# Patient Record
Sex: Female | Born: 1985 | Race: White | Hispanic: No | Marital: Married | Smoking: Never smoker
Health system: Southern US, Community
[De-identification: ages and names within clinical notes are randomized; demographics above are authoritative.]

## PROBLEM LIST (undated history)

## (undated) DIAGNOSIS — R87629 Unspecified abnormal cytological findings in specimens from vagina: Secondary | ICD-10-CM

## (undated) DIAGNOSIS — D649 Anemia, unspecified: Secondary | ICD-10-CM

## (undated) HISTORY — PX: LEEP: SHX91

## (undated) HISTORY — DX: Unspecified abnormal cytological findings in specimens from vagina: R87.629

## (undated) HISTORY — PX: OTHER SURGICAL HISTORY: SHX169

---

## 2011-12-08 DIAGNOSIS — F988 Other specified behavioral and emotional disorders with onset usually occurring in childhood and adolescence: Secondary | ICD-10-CM | POA: Insufficient documentation

## 2015-01-08 DIAGNOSIS — R61 Generalized hyperhidrosis: Secondary | ICD-10-CM | POA: Insufficient documentation

## 2016-01-17 DIAGNOSIS — L709 Acne, unspecified: Secondary | ICD-10-CM | POA: Insufficient documentation

## 2016-01-17 DIAGNOSIS — Z8742 Personal history of other diseases of the female genital tract: Secondary | ICD-10-CM | POA: Insufficient documentation

## 2017-10-15 DIAGNOSIS — B373 Candidiasis of vulva and vagina: Secondary | ICD-10-CM | POA: Diagnosis not present

## 2017-10-26 DIAGNOSIS — R69 Illness, unspecified: Secondary | ICD-10-CM | POA: Diagnosis not present

## 2017-11-02 DIAGNOSIS — R69 Illness, unspecified: Secondary | ICD-10-CM | POA: Diagnosis not present

## 2017-11-05 DIAGNOSIS — J0300 Acute streptococcal tonsillitis, unspecified: Secondary | ICD-10-CM | POA: Diagnosis not present

## 2017-11-23 DIAGNOSIS — S335XXA Sprain of ligaments of lumbar spine, initial encounter: Secondary | ICD-10-CM | POA: Diagnosis not present

## 2017-11-23 DIAGNOSIS — M9903 Segmental and somatic dysfunction of lumbar region: Secondary | ICD-10-CM | POA: Diagnosis not present

## 2017-11-23 DIAGNOSIS — S76012A Strain of muscle, fascia and tendon of left hip, initial encounter: Secondary | ICD-10-CM | POA: Diagnosis not present

## 2017-11-23 DIAGNOSIS — M9902 Segmental and somatic dysfunction of thoracic region: Secondary | ICD-10-CM | POA: Diagnosis not present

## 2017-11-23 DIAGNOSIS — S46012A Strain of muscle(s) and tendon(s) of the rotator cuff of left shoulder, initial encounter: Secondary | ICD-10-CM | POA: Diagnosis not present

## 2017-11-23 DIAGNOSIS — M9901 Segmental and somatic dysfunction of cervical region: Secondary | ICD-10-CM | POA: Diagnosis not present

## 2017-11-23 DIAGNOSIS — M25512 Pain in left shoulder: Secondary | ICD-10-CM | POA: Diagnosis not present

## 2017-11-23 DIAGNOSIS — M25552 Pain in left hip: Secondary | ICD-10-CM | POA: Diagnosis not present

## 2017-11-30 DIAGNOSIS — M25512 Pain in left shoulder: Secondary | ICD-10-CM | POA: Diagnosis not present

## 2017-11-30 DIAGNOSIS — S46012A Strain of muscle(s) and tendon(s) of the rotator cuff of left shoulder, initial encounter: Secondary | ICD-10-CM | POA: Diagnosis not present

## 2017-11-30 DIAGNOSIS — M25552 Pain in left hip: Secondary | ICD-10-CM | POA: Diagnosis not present

## 2017-11-30 DIAGNOSIS — M9903 Segmental and somatic dysfunction of lumbar region: Secondary | ICD-10-CM | POA: Diagnosis not present

## 2017-11-30 DIAGNOSIS — M9901 Segmental and somatic dysfunction of cervical region: Secondary | ICD-10-CM | POA: Diagnosis not present

## 2017-11-30 DIAGNOSIS — M9902 Segmental and somatic dysfunction of thoracic region: Secondary | ICD-10-CM | POA: Diagnosis not present

## 2017-11-30 DIAGNOSIS — S335XXA Sprain of ligaments of lumbar spine, initial encounter: Secondary | ICD-10-CM | POA: Diagnosis not present

## 2017-11-30 DIAGNOSIS — S76012A Strain of muscle, fascia and tendon of left hip, initial encounter: Secondary | ICD-10-CM | POA: Diagnosis not present

## 2017-12-09 DIAGNOSIS — S46012A Strain of muscle(s) and tendon(s) of the rotator cuff of left shoulder, initial encounter: Secondary | ICD-10-CM | POA: Diagnosis not present

## 2017-12-09 DIAGNOSIS — M25552 Pain in left hip: Secondary | ICD-10-CM | POA: Diagnosis not present

## 2017-12-09 DIAGNOSIS — M9902 Segmental and somatic dysfunction of thoracic region: Secondary | ICD-10-CM | POA: Diagnosis not present

## 2017-12-09 DIAGNOSIS — M9903 Segmental and somatic dysfunction of lumbar region: Secondary | ICD-10-CM | POA: Diagnosis not present

## 2017-12-09 DIAGNOSIS — S335XXA Sprain of ligaments of lumbar spine, initial encounter: Secondary | ICD-10-CM | POA: Diagnosis not present

## 2017-12-09 DIAGNOSIS — S76012A Strain of muscle, fascia and tendon of left hip, initial encounter: Secondary | ICD-10-CM | POA: Diagnosis not present

## 2017-12-09 DIAGNOSIS — M9901 Segmental and somatic dysfunction of cervical region: Secondary | ICD-10-CM | POA: Diagnosis not present

## 2017-12-09 DIAGNOSIS — M25512 Pain in left shoulder: Secondary | ICD-10-CM | POA: Diagnosis not present

## 2018-02-15 DIAGNOSIS — M9903 Segmental and somatic dysfunction of lumbar region: Secondary | ICD-10-CM | POA: Diagnosis not present

## 2018-02-15 DIAGNOSIS — M9901 Segmental and somatic dysfunction of cervical region: Secondary | ICD-10-CM | POA: Diagnosis not present

## 2018-02-15 DIAGNOSIS — M9902 Segmental and somatic dysfunction of thoracic region: Secondary | ICD-10-CM | POA: Diagnosis not present

## 2018-02-15 DIAGNOSIS — S335XXA Sprain of ligaments of lumbar spine, initial encounter: Secondary | ICD-10-CM | POA: Diagnosis not present

## 2018-02-15 DIAGNOSIS — M25512 Pain in left shoulder: Secondary | ICD-10-CM | POA: Diagnosis not present

## 2018-02-15 DIAGNOSIS — S46012A Strain of muscle(s) and tendon(s) of the rotator cuff of left shoulder, initial encounter: Secondary | ICD-10-CM | POA: Diagnosis not present

## 2018-03-01 DIAGNOSIS — J00 Acute nasopharyngitis [common cold]: Secondary | ICD-10-CM | POA: Diagnosis not present

## 2018-03-07 DIAGNOSIS — J206 Acute bronchitis due to rhinovirus: Secondary | ICD-10-CM | POA: Diagnosis not present

## 2018-05-25 DIAGNOSIS — R87629 Unspecified abnormal cytological findings in specimens from vagina: Secondary | ICD-10-CM

## 2018-05-25 HISTORY — DX: Unspecified abnormal cytological findings in specimens from vagina: R87.629

## 2018-05-25 NOTE — L&D Delivery Note (Signed)
Patient is a 33 y.o. now G1P1 s/p NSVD at [redacted]w[redacted]d, who was admitted for PPROM.  She progressed with/without augmentation to complete and pushed 43 minutes to deliver. NICU called for delivery  Cord clamping delayed by one minute then clamped by CNM and cut by FOB. Baby taken to NICU for evaluation.  Placenta intact and spontaneous, bleeding minimal.  1st degree laceration repaired without difficulty.  Mom and baby stable prior to transfer to postpartum. She plans on breastfeeding. She requests POPs for birth control.  Delivery Note At 8:19 PM a viable and healthy female was delivered via  (Presentation: LOA ).  APGAR: 7, 8 ; weight 2566g (5lb 10.5oz) .    Placenta intact and spontaneous, bleeding minimal. 3V Cord:  with the following complications: preterm delivery at 35 weeks.  Cord pH: 7.19  Anesthesia:  Epidural Episiotomy:  none Lacerations: 1st degree Suture Repair: 3.0 vicryl Est. Blood Loss (mL): 153  Mom to postpartum.  Baby to Couplet care / Skin to Skin.  Lajean Manes CNM 01/23/2019, 8:44 PM

## 2018-07-25 ENCOUNTER — Encounter: Payer: Self-pay | Admitting: Obstetrics & Gynecology

## 2018-07-25 ENCOUNTER — Ambulatory Visit (INDEPENDENT_AMBULATORY_CARE_PROVIDER_SITE_OTHER): Payer: 59 | Admitting: Obstetrics & Gynecology

## 2018-07-25 VITALS — BP 114/75 | HR 71 | Ht 67.0 in | Wt 142.0 lb

## 2018-07-25 DIAGNOSIS — Z1151 Encounter for screening for human papillomavirus (HPV): Secondary | ICD-10-CM

## 2018-07-25 DIAGNOSIS — Z113 Encounter for screening for infections with a predominantly sexual mode of transmission: Secondary | ICD-10-CM

## 2018-07-25 DIAGNOSIS — O3440 Maternal care for other abnormalities of cervix, unspecified trimester: Secondary | ICD-10-CM

## 2018-07-25 DIAGNOSIS — Z3400 Encounter for supervision of normal first pregnancy, unspecified trimester: Secondary | ICD-10-CM

## 2018-07-25 DIAGNOSIS — Z124 Encounter for screening for malignant neoplasm of cervix: Secondary | ICD-10-CM | POA: Diagnosis not present

## 2018-07-25 DIAGNOSIS — R69 Illness, unspecified: Secondary | ICD-10-CM | POA: Diagnosis not present

## 2018-07-25 LAB — POCT URINALYSIS DIPSTICK OB
KETONES UA: NEGATIVE
Leukocytes, UA: NEGATIVE
NITRITE UA: NEGATIVE
PROTEIN: NEGATIVE
Spec Grav, UA: 1.015 (ref 1.010–1.025)
pH, UA: 6 (ref 5.0–8.0)

## 2018-07-25 NOTE — Patient Instructions (Addendum)
First Trimester of Pregnancy The first trimester of pregnancy is from week 1 until the end of week 13 (months 1 through 3). A week after a sperm fertilizes an egg, the egg will implant on the wall of the uterus. This embryo will begin to develop into a baby. Genes from you and your partner will form the baby. The female genes will determine whether the baby will be a boy or a girl. At 6-8 weeks, the eyes and face will be formed, and the heartbeat can be seen on ultrasound. At the end of 12 weeks, all the baby's organs will be formed. Now that you are pregnant, you will want to do everything you can to have a healthy baby. Two of the most important things are to get good prenatal care and to follow your health care provider's instructions. Prenatal care is all the medical care you receive before the baby's birth. This care will help prevent, find, and treat any problems during the pregnancy and childbirth. Body changes during your first trimester Your body goes through many changes during pregnancy. The changes vary from woman to woman.  You may gain or lose a couple of pounds at first.  You may feel sick to your stomach (nauseous) and you may throw up (vomit). If the vomiting is uncontrollable, call your health care provider.  You may tire easily.  You may develop headaches that can be relieved by medicines. All medicines should be approved by your health care provider.  You may urinate more often. Painful urination may mean you have a bladder infection.  You may develop heartburn as a result of your pregnancy.  You may develop constipation because certain hormones are causing the muscles that push stool through your intestines to slow down.  You may develop hemorrhoids or swollen veins (varicose veins).  Your breasts may begin to grow larger and become tender. Your nipples may stick out more, and the tissue that surrounds them (areola) may become darker.  Your gums may bleed and may be  sensitive to brushing and flossing.  Dark spots or blotches (chloasma, mask of pregnancy) may develop on your face. This will likely fade after the baby is born.  Your menstrual periods will stop.  You may have a loss of appetite.  You may develop cravings for certain kinds of food.  You may have changes in your emotions from day to day, such as being excited to be pregnant or being concerned that something may go wrong with the pregnancy and baby.  You may have more vivid and strange dreams.  You may have changes in your hair. These can include thickening of your hair, rapid growth, and changes in texture. Some women also have hair loss during or after pregnancy, or hair that feels dry or thin. Your hair will most likely return to normal after your baby is born. What to expect at prenatal visits During a routine prenatal visit:  You will be weighed to make sure you and the baby are growing normally.  Your blood pressure will be taken.  Your abdomen will be measured to track your baby's growth.  The fetal heartbeat will be listened to between weeks 10 and 14 of your pregnancy.  Test results from any previous visits will be discussed. Your health care provider may ask you:  How you are feeling.  If you are feeling the baby move.  If you have had any abnormal symptoms, such as leaking fluid, bleeding, severe headaches, or abdominal   cramping.  If you are using any tobacco products, including cigarettes, chewing tobacco, and electronic cigarettes.  If you have any questions. Other tests that may be performed during your first trimester include:  Blood tests to find your blood type and to check for the presence of any previous infections. The tests will also be used to check for low iron levels (anemia) and protein on red blood cells (Rh antibodies). Depending on your risk factors, or if you previously had diabetes during pregnancy, you may have tests to check for high blood sugar  that affects pregnant women (gestational diabetes).  Urine tests to check for infections, diabetes, or protein in the urine.  An ultrasound to confirm the proper growth and development of the baby.  Fetal screens for spinal cord problems (spina bifida) and Down syndrome.  HIV (human immunodeficiency virus) testing. Routine prenatal testing includes screening for HIV, unless you choose not to have this test.  You may need other tests to make sure you and the baby are doing well. Follow these instructions at home: Medicines  Follow your health care provider's instructions regarding medicine use. Specific medicines may be either safe or unsafe to take during pregnancy.  Take a prenatal vitamin that contains at least 600 micrograms (mcg) of folic acid.  If you develop constipation, try taking a stool softener if your health care provider approves. Eating and drinking   Eat a balanced diet that includes fresh fruits and vegetables, whole grains, good sources of protein such as meat, eggs, or tofu, and low-fat dairy. Your health care provider will help you determine the amount of weight gain that is right for you.  Avoid raw meat and uncooked cheese. These carry germs that can cause birth defects in the baby.  Eating four or five small meals rather than three large meals a day may help relieve nausea and vomiting. If you start to feel nauseous, eating a few soda crackers can be helpful. Drinking liquids between meals, instead of during meals, also seems to help ease nausea and vomiting.  Limit foods that are high in fat and processed sugars, such as fried and sweet foods.  To prevent constipation: ? Eat foods that are high in fiber, such as fresh fruits and vegetables, whole grains, and beans. ? Drink enough fluid to keep your urine clear or pale yellow. Activity  Exercise only as directed by your health care provider. Most women can continue their usual exercise routine during  pregnancy. Try to exercise for 30 minutes at least 5 days a week. Exercising will help you: ? Control your weight. ? Stay in shape. ? Be prepared for labor and delivery.  Experiencing pain or cramping in the lower abdomen or lower back is a good sign that you should stop exercising. Check with your health care provider before continuing with normal exercises.  Try to avoid standing for long periods of time. Move your legs often if you must stand in one place for a long time.  Avoid heavy lifting.  Wear low-heeled shoes and practice good posture.  You may continue to have sex unless your health care provider tells you not to. Relieving pain and discomfort  Wear a good support bra to relieve breast tenderness.  Take warm sitz baths to soothe any pain or discomfort caused by hemorrhoids. Use hemorrhoid cream if your health care provider approves.  Rest with your legs elevated if you have leg cramps or low back pain.  If you develop varicose veins in   your legs, wear support hose. Elevate your feet for 15 minutes, 3-4 times a day. Limit salt in your diet. Prenatal care  Schedule your prenatal visits by the twelfth week of pregnancy. They are usually scheduled monthly at first, then more often in the last 2 months before delivery.  Write down your questions. Take them to your prenatal visits.  Keep all your prenatal visits as told by your health care provider. This is important. Safety  Wear your seat belt at all times when driving.  Make a list of emergency phone numbers, including numbers for family, friends, the hospital, and police and fire departments. General instructions  Ask your health care provider for a referral to a local prenatal education class. Begin classes no later than the beginning of month 6 of your pregnancy.  Ask for help if you have counseling or nutritional needs during pregnancy. Your health care provider can offer advice or refer you to specialists for help  with various needs.  Do not use hot tubs, steam rooms, or saunas.  Do not douche or use tampons or scented sanitary pads.  Do not cross your legs for long periods of time.  Avoid cat litter boxes and soil used by cats. These carry germs that can cause birth defects in the baby and possibly loss of the fetus by miscarriage or stillbirth.  Avoid all smoking, herbs, alcohol, and medicines not prescribed by your health care provider. Chemicals in these products affect the formation and growth of the baby.  Do not use any products that contain nicotine or tobacco, such as cigarettes and e-cigarettes. If you need help quitting, ask your health care provider. You may receive counseling support and other resources to help you quit.  Schedule a dentist appointment. At home, brush your teeth with a soft toothbrush and be gentle when you floss. Contact a health care provider if:  You have dizziness.  You have mild pelvic cramps, pelvic pressure, or nagging pain in the abdominal area.  You have persistent nausea, vomiting, or diarrhea.  You have a bad smelling vaginal discharge.  You have pain when you urinate.  You notice increased swelling in your face, hands, legs, or ankles.  You are exposed to fifth disease or chickenpox.  You are exposed to [place] measles (rubella) and have never had it. Get help right away if:  You have a fever.  You are leaking fluid from your vagina.  You have spotting or bleeding from your vagina.  You have severe abdominal cramping or pain.  You have rapid weight gain or loss.  You vomit blood or material that looks like coffee grounds.  You develop a severe headache.  You have shortness of breath.  You have any kind of trauma, such as from a fall or a car accident. Summary  The first trimester of pregnancy is from week 1 until the end of week 13 (months 1 through 3).  Your body goes through many changes during pregnancy. The changes vary from  woman to woman.  You will have routine prenatal visits. During those visits, your health care provider will examine you, discuss any test results you may have, and talk with you about how you are feeling. This information is not intended to replace advice given to you by your health care provider. Make sure you discuss any questions you have with your health care provider. Document Released: 05/05/2001 Document Revised: 04/22/2016 Document Reviewed: 04/22/2016 Elsevier Interactive Patient Education  2019 [employer].  Cervical Cerclage  Cervical cerclage is a surgical procedure to correct a cervix that opens up and thins out before pregnancy is at term (cervical insufficiency, also called incompetent cervix). This condition can cause labor to start early (prematurely). This procedure involves using stitches to sew the cervix shut during pregnancy. Your surgeon may use ultrasound equipment to help guide the procedure and monitor your baby. Ultrasound equipment uses sound waves to take images of your cervix and uterus. Your surgeon will assess these images on a monitor in the operating room. Tell a health care provider about:  Any allergies you have, especially any allergies related to prescribed medicine, stitches, or anesthetic medicines.  All medicines you are taking, including vitamins, herbs, eye drops, creams, and over-the-counter medicines. Bring a list of all of your medicines to your appointment.  Your medical history, including prior labor deliveries.  Any problems you or family members have had with anesthetic medicines.  Any blood disorders you have.  Any surgeries you have had, including prior cervical stitching.  Any medical conditions you have.  Whether you are pregnant or may be pregnant. What are the risks? Generally, this is a safe procedure. However, problems may occur, including:  Infection, such as infection of the cervix or amniotic sac.  Vaginal  bleeding.  Allergic reactions to medicines.  Damage to other structures or organs, such as tearing (rupture) of membranes or cervical laceration.  Premature contractions including going into early labor and delivery.  Cervical dystocia, which occurs when the cervix is unable to dilate normally during labor. What happens before the procedure? Staying hydrated Follow instructions from your health care provider about hydration, which may include:  Up to 2 hours before the procedure - you may continue to drink clear liquids, such as water, clear fruit juice, black coffee, and plain tea. Eating and drinking restrictions Follow instructions from your health care provider about eating and drinking, which may include:  8 hours before the procedure - stop eating heavy meals or foods such as meat, fried foods, or fatty foods.  6 hours before the procedure - stop eating light meals or foods, such as toast or cereal.  6 hours before the procedure - stop drinking milk or drinks that contain milk.  2 hours before the procedure - stop drinking clear liquids. Medicines  Ask your health care provider about: ? Changing or stopping your regular medicines. This is especially important if you are taking diabetes medicines or blood thinners. ? Taking medicines such as aspirin and ibuprofen. These medicines can thin your blood. Do not take these medicines before your procedure if your health care provider instructs you not to.  You may be given antibiotic medicine to help prevent infection. General instructions  Do not put on any lotion, deodorant, or perfume.  Remove contact lenses and jewelry.  Ask your health care provider how your surgical site will be marked or identified.  You may have an exam or testing.  You may have a blood or urine sample taken.  Plan to have someone take you home from the hospital or clinic.  If you will be going home right after the procedure, plan to have someone  with you for 24 hours. What happens during the procedure?  To reduce your risk of infection: ? Your health care team will wash or sanitize their hands. ? Your skin will be washed with soap.  An IV tube will be inserted into one of your veins.  You may be given one  or more of the following: ? A medicine to help you relax (sedative). ? A medicine to numb the area (local anesthetic). ? A medicine to make you fall asleep (general anesthetic). ? A medicine that is injected into your spine to numb the area below and slightly above the injection site (spinal anesthetic). ? A medicine that is injected into an area of your body to numb everything below the injection site (regional anesthetic).  A lubricated instrument (speculum) will be inserted into your vagina. The speculum will be widened to open the walls of your vagina so your surgeon can see your cervix.  Your cervix will be grasped and tightly stitched closed (sutured). To do this, your surgeon will stitch a strong band of thread around your cervix, then the thread will be tightened to hold your cervix shut. The procedure may vary among health care providers and hospitals. What happens after the procedure?  Your blood pressure, heart rate, breathing rate, and blood oxygen level will be monitored until the medicines you were given have worn off. You will be monitored for premature contractions.  You may have light bleeding and mild cramping.  You may have to wear compression stockings. These stockings help to prevent blood clots and reduce swelling in your legs.  Do not drive for 24 hours if you received a sedative.  You may be put on bed rest.  You may be given medicine to prevent infection.  You may be given an injection of a hormone (progesterone) to prevent your uterus from tightening (contracting). Summary  Cervical cerclage is a surgical procedure that involves using stitches to sew the cervix shut during pregnancy.  Your  blood pressure, heart rate, breathing rate, and blood oxygen level will be monitored until the medicines you were given have worn off. You will be monitored for premature contractions.  You may need to be on bed rest after the procedure.  Plan to have someone take you home from the hospital or clinic. This information is not intended to replace advice given to you by your health care provider. Make sure you discuss any questions you have with your health care provider. Document Released: 04/23/2008 Document Revised: 01/03/2016 Document Reviewed: 12/26/2015 Elsevier Interactive Patient Education  2019 [employer].

## 2018-07-25 NOTE — Progress Notes (Signed)
  Subjective:    Debbie Harrison is being seen today for her first obstetrical visit. G1P0 This is not a planned pregnancy. She is at [redacted]w[redacted]d gestation. Her obstetrical history is significant for being told that she could not get pregnant after her CKC. . Relationship with FOB: significant other, living together. Patient does intend to breast feed. Pregnancy history fully reviewed.  Pt drinks ETOH ~ 2 glasses of win per week prior to pregnancy.  Pt has 2 dogs.    Cold Knife cone 6 year prev at [facility] Med Breast reconstruction 09/2017  Patient reports breast tenderness.  Review of Systems:   Review of Systems  Objective:     BP 114/75   Pulse 71   Ht 5\' 7"  (1.702 m)   Wt 142 lb (64.4 kg)   LMP 05/23/2018 (Approximate)   BMI 22.24 kg/m  Physical Exam  Exam General Appearance:    Alert, cooperative, no distress, appears stated age  Head:    Normocephalic, without obvious abnormality, atraumatic  Eyes:    conjunctiva/corneas clear, EOM's intact, both eyes  Ears:    Normal external ear canals, both ears  Nose:   Nares normal, septum midline, mucosa normal, no drainage    or sinus tenderness  Throat:   Lips, mucosa, and tongue normal; teeth and gums normal  Neck:   Supple, symmetrical, trachea midline, no adenopathy;    thyroid:  no enlargement/tenderness/nodules  Back:     Symmetric, no curvature, ROM normal, no CVA tenderness  Lungs:     Clear to auscultation bilaterally, respirations unlabored  Chest Wall:    No tenderness or deformity   Heart:    Regular rate and rhythm, S1 and S2 normal, no murmur, rub   or gallop  Breast Exam:    No tenderness, masses, or nipple abnormality; incision from breasts implants and reconstruction.   Abdomen:     Soft, non-tender, bowel sounds active all four quadrants,    no masses, no organomegaly  Genitalia:    Normal female without lesion, discharge or tenderness   Cervix appear short and distorted. Prev CKC changes  noted  Extremities:   Extremities  normal, atraumatic, no cyanosis or edema  Pulses:   2+ and symmetric all extremities  Skin:   Skin color, texture, turgor normal, no rashes or lesions      Assessment:    Pregnancy: G1P0 Patient Active Problem List   Diagnosis Date Noted  . Supervision of normal first pregnancy, antepartum 07/25/2018  . Cervical abnormality in pregnancy, antepartum 07/25/2018       Plan:     Initial labs drawn. Prenatal vitamins. Problem list reviewed and updated. AFP3 discussed: requested. Role of ultrasound in pregnancy discussed; fetal survey: requested. Amniocentesis discussed: not indicated. Follow up in 4 weeks. 60% of 60 min visit spent on counseling and coordination of care.  Requests 1st trimester screen [place] to TV length.  Pt given info on cerclage just for information at present.    Willodean Rosenthal 07/25/2018

## 2018-07-25 NOTE — Progress Notes (Signed)
DATING AND VIABILITY SONOGRAM   Debbie Harrison is a 33 y.o. year old G1P0 with LMP Patient's last menstrual period was 05/23/2018 (approximate). which would correlate to  [redacted]w[redacted]d weeks gestation.  She has irregular menstrual cycles.   She is here today for a confirmatory initial sonogram.    GESTATION: SINGLETON     FETAL ACTIVITY:          Heart rate      168          The fetus is active.   ADNEXA: The ovaries are normal.    GESTATIONAL AGE AND  BIOMETRICS:  Gestational criteria: Estimated Date of Delivery: 02/27/19 by LMP now at [redacted]w[redacted]d  Previous Scans:0  GESTATIONAL SAC           3.33cm        8-3weeks  CROWN RUMP LENGTH          2.26cm       9-0weeks                                                                               AVERAGE EGA(BY THIS SCAN): 8-5 weeks  WORKING EDD( LMP ):  02-27-2019     TECHNICIAN COMMENTS: Patient informed that the ultrasound is considered a limited obstetric ultrasound and is not intended to be a complete ultrasound exam. Patient also informed that the ultrasound is not being completed with the intent of assessing for fetal or placental anomalies or any pelvic abnormalities. Explained that the purpose of today's ultrasound is to assess for fetal heart rate. Patient acknowledges the purpose of the exam and the limitations of the study.   Armandina Stammer 07/25/2018 10:20 AM

## 2018-07-27 LAB — CULTURE, OB URINE

## 2018-07-27 LAB — URINE CULTURE, OB REFLEX: Organism ID, Bacteria: NO GROWTH

## 2018-07-28 LAB — CYTOLOGY - PAP
Chlamydia: NEGATIVE
Diagnosis: NEGATIVE
HPV: DETECTED — AB
Neisseria Gonorrhea: NEGATIVE

## 2018-08-02 LAB — OBSTETRIC PANEL, INCLUDING HIV
Antibody Screen: NEGATIVE
Basophils Absolute: 0 10*3/uL (ref 0.0–0.2)
Basos: 0 %
EOS (ABSOLUTE): 0.1 10*3/uL (ref 0.0–0.4)
Eos: 1 %
HEMATOCRIT: 37.4 % (ref 34.0–46.6)
HIV Screen 4th Generation wRfx: NONREACTIVE
Hemoglobin: 12.6 g/dL (ref 11.1–15.9)
Hepatitis B Surface Ag: NEGATIVE
Immature Grans (Abs): 0.1 10*3/uL (ref 0.0–0.1)
Immature Granulocytes: 1 %
Lymphocytes Absolute: 1.7 10*3/uL (ref 0.7–3.1)
Lymphs: 15 %
MCH: 30.6 pg (ref 26.6–33.0)
MCHC: 33.7 g/dL (ref 31.5–35.7)
MCV: 91 fL (ref 79–97)
Monocytes Absolute: 0.6 10*3/uL (ref 0.1–0.9)
Monocytes: 5 %
Neutrophils Absolute: 8.3 10*3/uL — ABNORMAL HIGH (ref 1.4–7.0)
Neutrophils: 78 %
Platelets: 254 10*3/uL (ref 150–450)
RBC: 4.12 x10E6/uL (ref 3.77–5.28)
RDW: 12.3 % (ref 11.7–15.4)
RPR: NONREACTIVE
Rh Factor: POSITIVE
Rubella Antibodies, IGG: 1.54 index (ref >0.99)
WBC: 10.7 10*3/uL (ref 3.4–10.8)

## 2018-08-02 LAB — CYSTIC FIBROSIS GENE TEST

## 2018-08-02 LAB — SMN1 COPY NUMBER ANALYSIS (SMA CARRIER SCREENING)

## 2018-08-15 ENCOUNTER — Encounter (HOSPITAL_COMMUNITY): Payer: Self-pay

## 2018-08-22 ENCOUNTER — Encounter (HOSPITAL_COMMUNITY): Payer: Self-pay

## 2018-08-22 ENCOUNTER — Ambulatory Visit (HOSPITAL_COMMUNITY): Payer: 59 | Admitting: *Deleted

## 2018-08-22 ENCOUNTER — Ambulatory Visit (HOSPITAL_COMMUNITY): Payer: 59

## 2018-08-22 ENCOUNTER — Other Ambulatory Visit (HOSPITAL_COMMUNITY): Payer: Self-pay | Admitting: *Deleted

## 2018-08-22 ENCOUNTER — Other Ambulatory Visit: Payer: Self-pay

## 2018-08-22 ENCOUNTER — Ambulatory Visit (HOSPITAL_COMMUNITY)
Admission: RE | Admit: 2018-08-22 | Discharge: 2018-08-22 | Disposition: A | Discharge status: Home / Self Care | Payer: 59 | Source: Ambulatory Visit | Attending: Obstetrics and Gynecology | Admitting: Obstetrics and Gynecology

## 2018-08-22 ENCOUNTER — Other Ambulatory Visit (HOSPITAL_COMMUNITY): Payer: Self-pay | Admitting: Obstetrics and Gynecology

## 2018-08-22 DIAGNOSIS — O3440 Maternal care for other abnormalities of cervix, unspecified trimester: Secondary | ICD-10-CM

## 2018-08-22 DIAGNOSIS — Z3400 Encounter for supervision of normal first pregnancy, unspecified trimester: Secondary | ICD-10-CM

## 2018-08-22 DIAGNOSIS — Z3A13 13 weeks gestation of pregnancy: Secondary | ICD-10-CM | POA: Diagnosis not present

## 2018-08-22 DIAGNOSIS — Z36 Encounter for antenatal screening for chromosomal anomalies: Secondary | ICD-10-CM | POA: Diagnosis not present

## 2018-08-22 DIAGNOSIS — Z9889 Other specified postprocedural states: Secondary | ICD-10-CM

## 2018-08-22 DIAGNOSIS — Z3682 Encounter for antenatal screening for nuchal translucency: Secondary | ICD-10-CM

## 2018-08-24 ENCOUNTER — Ambulatory Visit (INDEPENDENT_AMBULATORY_CARE_PROVIDER_SITE_OTHER): Payer: 59 | Admitting: Family Medicine

## 2018-08-24 VITALS — BP 112/73 | HR 84

## 2018-08-24 DIAGNOSIS — Z3A13 13 weeks gestation of pregnancy: Secondary | ICD-10-CM

## 2018-08-24 DIAGNOSIS — O3441 Maternal care for other abnormalities of cervix, first trimester: Secondary | ICD-10-CM

## 2018-08-24 DIAGNOSIS — O3440 Maternal care for other abnormalities of cervix, unspecified trimester: Secondary | ICD-10-CM

## 2018-08-24 DIAGNOSIS — Z3401 Encounter for supervision of normal first pregnancy, first trimester: Secondary | ICD-10-CM

## 2018-08-24 DIAGNOSIS — Z3400 Encounter for supervision of normal first pregnancy, unspecified trimester: Secondary | ICD-10-CM

## 2018-08-24 NOTE — Progress Notes (Signed)
   TELEHEALTH VIRTUAL OBSTETRICS VISIT ENCOUNTER NOTE  I connected with Debbie Harrison on 08/24/18 at  3:30 PM EDT by telephone at home and verified that I am speaking with the correct person using two identifiers.   I discussed the limitations, risks, security and privacy concerns of performing an evaluation and management service by telephone and the availability of in person appointments. I also discussed with the patient that there may be a patient responsible charge related to this service. The patient expressed understanding and agreed to proceed.  Subjective:  Debbie Harrison is a 33 y.o. G1P0 at [redacted]w[redacted]d being followed for ongoing prenatal care.  She is currently monitored for the following issues for this low-risk pregnancy and has Supervision of normal first pregnancy, antepartum and Cervical abnormality in pregnancy, antepartum on their problem list.  Patient reports no complaints. Reports fetal movement. Denies any contractions, bleeding or leaking of fluid.   The following portions of the patient's history were reviewed and updated as appropriate: allergies, current medications, past family history, past medical history, past social history, past surgical history and problem list.   Objective:   General:  Alert, oriented and cooperative.   Mental Status: Normal mood and affect perceived. Normal judgment and thought content.  Rest of physical exam deferred due to type of encounter  Assessment and Plan:  Pregnancy: G1P0 at [redacted]w[redacted]d 1. Supervision of normal first pregnancy, antepartum NT scan reviewed with patient. Low risk. Blood results still pending.  2. H/o LEEP Per Dr Judeth Cornfield, will get CL at anatomy scan  Preterm labor symptoms and general obstetric precautions including but not limited to vaginal bleeding, contractions, leaking of fluid and fetal movement were reviewed in detail with the patient.  I discussed the assessment and treatment plan with the patient. The patient was  provided an opportunity to ask questions and all were answered. The patient agreed with the plan and demonstrated an understanding of the instructions. The patient was advised to call back or seek an in-person office evaluation/go to MAU at [facility] for any urgent or concerning symptoms. Please refer to After Visit Summary for other counseling recommendations.   I provided 11 minutes of non-face-to-face time during this encounter.  Return in about 8 weeks (around 10/19/2018) for OB f/u.  Future Appointments  Date Time Provider Department Center  10/10/2018  7:40 AM WH-MFC NURSE WH-MFC MFC-US  10/10/2018  7:45 AM WH-MFC [place] 2 WH-MFCUS MFC-US    Levie Heritage, DO Center for [employer], [facility]

## 2018-09-13 ENCOUNTER — Other Ambulatory Visit (HOSPITAL_COMMUNITY): Payer: 59

## 2018-09-13 ENCOUNTER — Ambulatory Visit (HOSPITAL_COMMUNITY): Payer: 59

## 2018-10-10 ENCOUNTER — Other Ambulatory Visit: Payer: Self-pay

## 2018-10-10 ENCOUNTER — Ambulatory Visit (HOSPITAL_COMMUNITY): Payer: 59 | Admitting: *Deleted

## 2018-10-10 ENCOUNTER — Ambulatory Visit (HOSPITAL_COMMUNITY)
Admission: RE | Admit: 2018-10-10 | Discharge: 2018-10-10 | Disposition: A | Discharge status: Home / Self Care | Payer: 59 | Source: Ambulatory Visit | Attending: Obstetrics and Gynecology | Admitting: Obstetrics and Gynecology

## 2018-10-10 ENCOUNTER — Encounter (HOSPITAL_COMMUNITY): Payer: Self-pay

## 2018-10-10 VITALS — Temp 98.5°F

## 2018-10-10 DIAGNOSIS — Z9889 Other specified postprocedural states: Secondary | ICD-10-CM

## 2018-10-10 DIAGNOSIS — O3440 Maternal care for other abnormalities of cervix, unspecified trimester: Secondary | ICD-10-CM | POA: Diagnosis not present

## 2018-10-10 DIAGNOSIS — Z3400 Encounter for supervision of normal first pregnancy, unspecified trimester: Secondary | ICD-10-CM

## 2018-10-10 DIAGNOSIS — Z3A20 20 weeks gestation of pregnancy: Secondary | ICD-10-CM

## 2018-10-10 DIAGNOSIS — O3442 Maternal care for other abnormalities of cervix, second trimester: Secondary | ICD-10-CM | POA: Diagnosis not present

## 2018-10-10 DIAGNOSIS — Z363 Encounter for antenatal screening for malformations: Secondary | ICD-10-CM

## 2018-10-13 ENCOUNTER — Encounter: Payer: 59 | Admitting: Obstetrics & Gynecology

## 2018-10-20 ENCOUNTER — Ambulatory Visit (INDEPENDENT_AMBULATORY_CARE_PROVIDER_SITE_OTHER): Payer: 59 | Admitting: Family Medicine

## 2018-10-20 VITALS — BP 108/67 | HR 85 | Wt 147.0 lb

## 2018-10-20 DIAGNOSIS — Z3A21 21 weeks gestation of pregnancy: Secondary | ICD-10-CM

## 2018-10-20 DIAGNOSIS — O3442 Maternal care for other abnormalities of cervix, second trimester: Secondary | ICD-10-CM

## 2018-10-20 DIAGNOSIS — O3440 Maternal care for other abnormalities of cervix, unspecified trimester: Secondary | ICD-10-CM

## 2018-10-20 DIAGNOSIS — Z3400 Encounter for supervision of normal first pregnancy, unspecified trimester: Secondary | ICD-10-CM

## 2018-10-20 NOTE — Progress Notes (Signed)
TELEHEALTH OBSTETRICS PRENATAL VIRTUAL VIDEO VISIT ENCOUNTER NOTE  [facility]   I connected with Debbie Harrison on 10/20/18 at  1:30 PM EDT by WebEx Video Encounter at home and verified that I am speaking with the correct person using two identifiers.   I discussed the limitations, risks, security and privacy concerns of performing an evaluation and management service by telephone and the availability of in person appointments. I also discussed with the patient that there may be a patient responsible charge related to this service. The patient expressed understanding and agreed to proceed. Subjective:  Debbie Harrison is a 33 y.o. G1P0 at 403w3d being seen today for ongoing prenatal care.  She is currently monitored for the following issues for this high-risk pregnancy and has Supervision of normal first pregnancy, antepartum and Cervical abnormality in pregnancy, antepartum on their problem list.  Patient reports no complaints.  Contractions: Not present. Vag. Bleeding: None.  Movement: Present. Denies any leaking of fluid.   The following portions of the patient's history were reviewed and updated as appropriate: allergies, current medications, past family history, past medical history, past social history, past surgical history and problem list.   Objective:   Vitals:   10/20/18 1345  BP: 108/67  Pulse: 85  Weight: 147 lb (66.7 kg)    Fetal Status:     Movement: Present     General:  Alert, oriented and cooperative. Patient is in no acute distress.  Respiratory: Normal respiratory effort, no problems with respiration noted  Mental Status: Normal mood and affect. Normal behavior. Normal judgment and thought content.  Rest of physical exam deferred due to type of encounter  Imaging: [place] Mfm Ob Comp + 14 Wk  Result Date: 10/10/2018 ----------------------------------------------------------------------  OBSTETRICS REPORT                        (Signed Final 10/10/2018 09:22 am) ---------------------------------------------------------------------- Patient Info  ID #:       [id number]                          D.O.B.:  04/19/1986 (32 yrs)  Name:       Debbie Harrison                  Visit Date: 10/10/2018 07:41 am ---------------------------------------------------------------------- Performed By  Performed By:     Sandi MealyJovancia Adrien        Ref. Address:     [address]                    [address]                                                             [address] [place],                                                             [place] [id number]  Attending:        Noralee Spaceavi Shankar MD  [facility]  Referred By:      Willodean Rosenthal MD ---------------------------------------------------------------------- Orders   #  Description                          Code         Ordered By   1  [place] MFM OB COMP + 14 WK               69629.52     Lin Landsman  ----------------------------------------------------------------------   #  Order #                    Accession #                 Episode #   1  [id number]                  [id number]                  [id number]  ---------------------------------------------------------------------- Indications   Previous cervical surgery (LEEP)               O34.40   [redacted] weeks gestation of pregnancy                Z3A.20  ---------------------------------------------------------------------- Vital Signs  Weight (lb): 142                               Height:        5'7"  BMI:         22.24 ---------------------------------------------------------------------- Fetal Evaluation  Num Of Fetuses:         1  Fetal Heart Rate(bpm):  157  Cardiac Activity:       Observed  Presentation:           Cephalic  Placenta:                Posterior  P. Cord Insertion:      Visualized  Amniotic Fluid  AFI FV:      Within normal limits                              Largest Pocket(cm)                              3.58 ---------------------------------------------------------------------- Biometry  BPD:  45.9  mm     G. Age:  19w 6d         44  %    CI:        72.62   %    70 - 86                                                          FL/HC:      18.7   %    16.8 - 19.8  HC:      171.3  mm     G. Age:  19w 5d         29  %    HC/AC:      1.16        1.09 - 1.39  AC:      147.9  mm     G. Age:  20w 1d         47  %    FL/BPD:     69.7   %  FL:         32  mm     G. Age:  20w 0d         41  %    FL/AC:      21.6   %    20 - 24  HUM:      30.4  mm     G. Age:  20w 0d         55  %  CER:        21  mm     G. Age:  20w 0d         50  %  CM:        3.5  mm  Est. FW:     325  gm    0 lb 11 oz      50  % ---------------------------------------------------------------------- OB History  Gravidity:    1 ---------------------------------------------------------------------- Gestational Age  LMP:           20w 0d        Date:  05/23/18                 EDD:   02/27/19  U/S Today:     20w 0d                                        EDD:   02/27/19  Best:          20w 0d     Det. By:  LMP  (05/23/18)          EDD:   02/27/19 ---------------------------------------------------------------------- Anatomy  Cranium:               Appears normal         Aortic Arch:            Appears normal  Cavum:                 Appears normal         Ductal Arch:            Appears normal  Ventricles:  Appears normal         Diaphragm:              Appears normal  Choroid Plexus:        Appears normal         Stomach:                Appears normal, left                                                                        sided  Cerebellum:            Appears normal         Abdomen:                Appears normal  Posterior Fossa:       Appears normal         Abdominal  Wall:         Appears nml (cord                                                                        insert, abd wall)  Nuchal Fold:           Not well visualized    Cord Vessels:           Appears normal (3                                                                        vessel cord)  Face:                  Appears normal         Kidneys:                Appear normal                         (orbits and profile)  Lips:                  Appears normal         Bladder:                Appears normal  Thoracic:              Appears normal         Spine:                  Appears normal  Heart:                 Appears normal         Upper Extremities:      Appears normal                         (  4CH, axis, and                         situs)  RVOT:                  Appears normal         Lower Extremities:      Appears normal  LVOT:                  Appears normal ---------------------------------------------------------------------- Cervix Uterus Adnexa  Cervix  Length:           3.46  cm.  Normal appearance by transabdominal scan.  Left Ovary  Within normal limits.  Right Ovary  Within normal limits.  Cul De Sac  No free fluid seen.  Adnexa  No abnormality visualized. No adnexal mass  visualized. No free fluid. ---------------------------------------------------------------------- Impression  Patient returned for fetal anatomy scan. On first-trimester  screening, the risks of fetal aneuploidies were not increased.  We performed a fetal anatomy scan. No markers of  aneuploidies or fetal structural defects are seen. Fetal  biometry is consistent with her previously-established dates.  Amniotic fluid is normal and good fetal activity is seen.  Patient understands the limitations of ultrasound in detecting  fetal anomalies.  History of LEEP. On transabdominal scan, the cervix looks  long and closed (3.8 cm).  We reassured the patient of the findings.  ---------------------------------------------------------------------- Recommendations  Follow-up as clinically indicated. ----------------------------------------------------------------------                  Noralee Space, MD Electronically Signed Final Report   10/10/2018 09:22 am ----------------------------------------------------------------------   Assessment and Plan:  Pregnancy: G1P0 at [redacted]w[redacted]d 1. Supervision of normal first pregnancy, antepartum FM normal  2. Cervical abnormality in pregnancy, antepartum CL 3.8cm at [place].  Preterm labor symptoms and general obstetric precautions including but not limited to vaginal bleeding, contractions, leaking of fluid and fetal movement were reviewed in detail with the patient. I discussed the assessment and treatment plan with the patient. The patient was provided an opportunity to ask questions and all were answered. The patient agreed with the plan and demonstrated an understanding of the instructions. The patient was advised to call back or seek an in-person office evaluation/go to MAU at [facility] for any urgent or concerning symptoms. Please refer to After Visit Summary for other counseling recommendations.   I provided 11 minutes of face-to-face time during this encounter.  No follow-ups on file.  No future appointments.  Levie Heritage, DO Center for [employer], [facility]

## 2018-12-15 ENCOUNTER — Ambulatory Visit: Payer: 59 | Admitting: Family Medicine

## 2018-12-19 ENCOUNTER — Encounter: Payer: Self-pay | Admitting: Family Medicine

## 2018-12-19 ENCOUNTER — Other Ambulatory Visit: Payer: Self-pay

## 2018-12-19 ENCOUNTER — Ambulatory Visit (INDEPENDENT_AMBULATORY_CARE_PROVIDER_SITE_OTHER): Payer: 59 | Admitting: Family Medicine

## 2018-12-19 VITALS — BP 108/70 | HR 81 | Wt 157.0 lb

## 2018-12-19 DIAGNOSIS — Z23 Encounter for immunization: Secondary | ICD-10-CM

## 2018-12-19 DIAGNOSIS — O3440 Maternal care for other abnormalities of cervix, unspecified trimester: Secondary | ICD-10-CM

## 2018-12-19 DIAGNOSIS — Z3403 Encounter for supervision of normal first pregnancy, third trimester: Secondary | ICD-10-CM

## 2018-12-19 DIAGNOSIS — Z3A30 30 weeks gestation of pregnancy: Secondary | ICD-10-CM

## 2018-12-19 DIAGNOSIS — Z3400 Encounter for supervision of normal first pregnancy, unspecified trimester: Secondary | ICD-10-CM | POA: Diagnosis not present

## 2018-12-19 NOTE — Progress Notes (Signed)
   PRENATAL VISIT NOTE  Subjective:  Debbie Harrison is a 33 y.o. G1P0 at [redacted]w[redacted]d being seen today for ongoing prenatal care.  She is currently monitored for the following issues for this low-risk pregnancy and has Supervision of normal first pregnancy, antepartum and Cervical abnormality in pregnancy, antepartum on their problem list.  Patient reports no complaints.  Contractions: Not present. Vag. Bleeding: None.  Movement: Present. Denies leaking of fluid.   The following portions of the patient's history were reviewed and updated as appropriate: allergies, current medications, past family history, past medical history, past social history, past surgical history and problem list.   Objective:   Vitals:   12/19/18 0830  BP: 108/70  Pulse: 81  Weight: 157 lb (71.2 kg)    Fetal Status: Fetal Heart Rate (bpm): 155 Fundal Height: 30 cm Movement: Present     General:  Alert, oriented and cooperative. Patient is in no acute distress.  Skin: Skin is warm and dry. No rash noted.   Cardiovascular: Normal heart rate noted  Respiratory: Normal respiratory effort, no problems with respiration noted  Abdomen: Soft, gravid, appropriate for gestational age.  Pain/Pressure: Absent     Pelvic: Cervical exam deferred        Extremities: Normal range of motion.  Edema: None  Mental Status: Normal mood and affect. Normal behavior. Normal judgment and thought content.   Assessment and Plan:  Pregnancy: G1P0 at [redacted]w[redacted]d 1. Supervision of normal first pregnancy, antepartum FHT and FH normal. Occasional heartburn, better with tums. - Glucose Tolerance, 2 Hours w/1 Hour - HIV antibody (with reflex) - RPR - CBC  2. Cervical abnormality in pregnancy, antepartum H/o Leep. No evidence of PTL  Preterm labor symptoms and general obstetric precautions including but not limited to vaginal bleeding, contractions, leaking of fluid and fetal movement were reviewed in detail with the patient. Please refer to After  Visit Summary for other counseling recommendations.   Return in about 2 weeks (around 01/02/2019) for Virtual, OB f/u.  No future appointments.  Truett Mainland, DO

## 2018-12-19 NOTE — Progress Notes (Signed)
Patient doing 2 hour gtt. Kathrene Alu RN

## 2018-12-23 LAB — HIV ANTIBODY (ROUTINE TESTING W REFLEX): HIV Screen 4th Generation wRfx: NONREACTIVE

## 2018-12-23 LAB — CBC
Hematocrit: 34.5 % (ref 34.0–46.6)
Hemoglobin: 12.1 g/dL (ref 11.1–15.9)
MCH: 31.7 pg (ref 26.6–33.0)
MCHC: 35.1 g/dL (ref 31.5–35.7)
MCV: 90 fL (ref 79–97)
Platelets: 201 10*3/uL (ref 150–450)
RBC: 3.82 x10E6/uL (ref 3.77–5.28)
RDW: 11.9 % (ref 11.7–15.4)
WBC: 8.9 10*3/uL (ref 3.4–10.8)

## 2018-12-23 LAB — GLUCOSE TOLERANCE, 2 HOURS W/ 1HR
Glucose, 1 hour: 74 mg/dL (ref 65–179)
Glucose, Fasting: 77 mg/dL (ref 65–91)

## 2018-12-23 LAB — RPR: RPR Ser Ql: NONREACTIVE

## 2019-01-02 ENCOUNTER — Encounter: Payer: Self-pay | Admitting: Obstetrics & Gynecology

## 2019-01-02 ENCOUNTER — Ambulatory Visit (INDEPENDENT_AMBULATORY_CARE_PROVIDER_SITE_OTHER): Payer: 59 | Admitting: Obstetrics & Gynecology

## 2019-01-02 VITALS — Wt 155.0 lb

## 2019-01-02 DIAGNOSIS — O3440 Maternal care for other abnormalities of cervix, unspecified trimester: Secondary | ICD-10-CM

## 2019-01-02 DIAGNOSIS — Z3A32 32 weeks gestation of pregnancy: Secondary | ICD-10-CM

## 2019-01-02 DIAGNOSIS — O3443 Maternal care for other abnormalities of cervix, third trimester: Secondary | ICD-10-CM

## 2019-01-02 DIAGNOSIS — Z3400 Encounter for supervision of normal first pregnancy, unspecified trimester: Secondary | ICD-10-CM

## 2019-01-02 NOTE — Progress Notes (Signed)
   [place] VIRTUAL VIDEO VISIT ENCOUNTER NOTE  Provider location: Center for [place] at [facility]   I connected with Wendall Stade on 01/02/19 at  9:30 AM EDT by WebEx Video Encounter at home and verified that I am speaking with the correct person using two identifiers.   I discussed the limitations, risks, security and privacy concerns of performing an evaluation and management service virtually and the availability of in person appointments. I also discussed with the patient that there may be a patient responsible charge related to this service. The patient expressed understanding and agreed to proceed. Subjective:  Debbie Harrison is a 33 y.o. G1P0 at [redacted]w[redacted]d being seen today for ongoing prenatal care.  She is currently monitored for the following issues for this low-risk pregnancy and has Supervision of normal first pregnancy, antepartum and Cervical abnormality in pregnancy, antepartum on their problem list.  Patient reports no complaints.  Contractions: Not present. Vag. Bleeding: None.  Movement: Present. Denies any leaking of fluid.   The following portions of the patient's history were reviewed and updated as appropriate: allergies, current medications, past family history, past medical history, past social history, past surgical history and problem list.   Objective:   Vitals:   01/02/19 0934  Weight: 155 lb (70.3 kg)    Fetal Status:     Movement: Present     General:  Alert, oriented and cooperative. Patient is in no acute distress.  Respiratory: Normal respiratory effort, no problems with respiration noted  Mental Status: Normal mood and affect. Normal behavior. Normal judgment and thought content.  Rest of physical exam deferred due to type of encounter  Imaging: No results found.  Assessment and Plan:  Pregnancy: G1P0 at [redacted]w[redacted]d 1. Supervision of normal first pregnancy, antepartum No problems  Moving this week  2. Cervical  abnormality in pregnancy, antepartum   Preterm labor symptoms and general obstetric precautions including but not limited to vaginal bleeding, contractions, leaking of fluid and fetal movement were reviewed in detail with the patient. I discussed the assessment and treatment plan with the patient. The patient was provided an opportunity to ask questions and all were answered. The patient agreed with the plan and demonstrated an understanding of the instructions. The patient was advised to call back or seek an in-person office evaluation/go to MAU at [facility] for any urgent or concerning symptoms. Please refer to After Visit Summary for other counseling recommendations.   I provided 12 minutes of face-to-face time during this encounter.  Return in about 4 weeks (around 01/30/2019) for in person.  No future appointments.  Lavonia Drafts, MD Center for [employer], [place]

## 2019-01-22 ENCOUNTER — Inpatient Hospital Stay (HOSPITAL_COMMUNITY)
Admission: AD | Admit: 2019-01-22 | Discharge: 2019-01-25 | DRG: 807 | Disposition: A | Discharge status: Home / Self Care | Payer: No Typology Code available for payment source | Attending: Obstetrics and Gynecology | Admitting: Obstetrics and Gynecology

## 2019-01-22 ENCOUNTER — Encounter (HOSPITAL_COMMUNITY): Payer: Self-pay

## 2019-01-22 ENCOUNTER — Other Ambulatory Visit: Payer: Self-pay

## 2019-01-22 DIAGNOSIS — O42113 Preterm premature rupture of membranes, onset of labor more than 24 hours following rupture, third trimester: Secondary | ICD-10-CM | POA: Diagnosis not present

## 2019-01-22 DIAGNOSIS — Z3483 Encounter for supervision of other normal pregnancy, third trimester: Secondary | ICD-10-CM | POA: Diagnosis not present

## 2019-01-22 DIAGNOSIS — Z3A35 35 weeks gestation of pregnancy: Secondary | ICD-10-CM | POA: Diagnosis not present

## 2019-01-22 DIAGNOSIS — O42919 Preterm premature rupture of membranes, unspecified as to length of time between rupture and onset of labor, unspecified trimester: Secondary | ICD-10-CM | POA: Insufficient documentation

## 2019-01-22 DIAGNOSIS — Z3A34 34 weeks gestation of pregnancy: Secondary | ICD-10-CM | POA: Diagnosis not present

## 2019-01-22 DIAGNOSIS — Z20828 Contact with and (suspected) exposure to other viral communicable diseases: Secondary | ICD-10-CM | POA: Diagnosis present

## 2019-01-22 DIAGNOSIS — O42013 Preterm premature rupture of membranes, onset of labor within 24 hours of rupture, third trimester: Secondary | ICD-10-CM | POA: Diagnosis not present

## 2019-01-22 DIAGNOSIS — Z3A00 Weeks of gestation of pregnancy not specified: Secondary | ICD-10-CM | POA: Diagnosis not present

## 2019-01-22 DIAGNOSIS — Z3482 Encounter for supervision of other normal pregnancy, second trimester: Secondary | ICD-10-CM | POA: Diagnosis not present

## 2019-01-22 DIAGNOSIS — O42913 Preterm premature rupture of membranes, unspecified as to length of time between rupture and onset of labor, third trimester: Principal | ICD-10-CM | POA: Diagnosis present

## 2019-01-22 DIAGNOSIS — Z3490 Encounter for supervision of normal pregnancy, unspecified, unspecified trimester: Secondary | ICD-10-CM

## 2019-01-22 HISTORY — DX: Anemia, unspecified: D64.9

## 2019-01-22 LAB — URINALYSIS, ROUTINE W REFLEX MICROSCOPIC
Bilirubin Urine: NEGATIVE
Glucose, UA: NEGATIVE mg/dL
Ketones, ur: NEGATIVE mg/dL
Leukocytes,Ua: NEGATIVE
Nitrite: NEGATIVE
Protein, ur: NEGATIVE mg/dL
Specific Gravity, Urine: 1.004 — ABNORMAL LOW (ref 1.005–1.030)
pH: 6 (ref 5.0–8.0)

## 2019-01-22 LAB — CBC
HCT: 34.2 % — ABNORMAL LOW (ref 36.0–46.0)
Hemoglobin: 11.8 g/dL — ABNORMAL LOW (ref 12.0–15.0)
MCH: 29.7 pg (ref 26.0–34.0)
MCHC: 34.5 g/dL (ref 30.0–36.0)
MCV: 86.1 fL (ref 80.0–100.0)
Platelets: 217 10*3/uL (ref 150–400)
RBC: 3.97 MIL/uL (ref 3.87–5.11)
RDW: 12.5 % (ref 11.5–15.5)
WBC: 13.2 10*3/uL — ABNORMAL HIGH (ref 4.0–10.5)
nRBC: 0 % (ref 0.0–0.2)

## 2019-01-22 LAB — TYPE AND SCREEN
ABO/RH(D): A POS
Antibody Screen: NEGATIVE

## 2019-01-22 LAB — AMNISURE RUPTURE OF MEMBRANE (ROM) NOT AT ARMC: Amnisure ROM: POSITIVE

## 2019-01-22 LAB — SARS CORONAVIRUS 2 BY RT PCR (HOSPITAL ORDER, PERFORMED IN ~~LOC~~ HOSPITAL LAB): SARS Coronavirus 2: NEGATIVE

## 2019-01-22 MED ORDER — TERBUTALINE SULFATE 1 MG/ML IJ SOLN: 0.2500 mg | Freq: Once | INTRAMUSCULAR | Status: DC | PRN

## 2019-01-22 MED ORDER — SODIUM CHLORIDE 0.9 % IV SOLN: 2.0000 g | Freq: Four times a day (QID) | INTRAVENOUS | Status: DC

## 2019-01-22 MED ORDER — ONDANSETRON HCL 4 MG/2ML IJ SOLN: 4.0000 mg | Freq: Four times a day (QID) | INTRAMUSCULAR | Status: DC | PRN

## 2019-01-22 MED ORDER — OXYCODONE-ACETAMINOPHEN 5-325 MG PO TABS: 2.0000 | ORAL_TABLET | ORAL | Status: DC | PRN

## 2019-01-22 MED ORDER — MISOPROSTOL 50MCG HALF TABLET
50.0000 ug | ORAL_TABLET | Freq: Once | ORAL | Status: DC
  Filled 2019-01-22: qty 1

## 2019-01-22 MED ORDER — ACETAMINOPHEN 325 MG PO TABS: 650.0000 mg | ORAL_TABLET | ORAL | Status: DC | PRN

## 2019-01-22 MED ORDER — OXYCODONE-ACETAMINOPHEN 5-325 MG PO TABS: 1.0000 | ORAL_TABLET | ORAL | Status: DC | PRN

## 2019-01-22 MED ORDER — LIDOCAINE HCL (PF) 1 % IJ SOLN: 30.0000 mL | INTRAMUSCULAR | Status: DC | PRN

## 2019-01-22 MED ORDER — FLEET ENEMA 7-19 GM/118ML RE ENEM: 1.0000 | ENEMA | RECTAL | Status: DC | PRN

## 2019-01-22 MED ORDER — LACTATED RINGERS IV SOLN
500.0000 mL | INTRAVENOUS | Status: DC | PRN
  Administered 2019-01-23: 500 mL via INTRAVENOUS

## 2019-01-22 MED ORDER — OXYTOCIN BOLUS FROM INFUSION
500.0000 mL | Freq: Once | INTRAVENOUS | Status: AC
  Administered 2019-01-23: 500 mL via INTRAVENOUS

## 2019-01-22 MED ORDER — SODIUM CHLORIDE 0.9 % IV SOLN
5.0000 10*6.[IU] | Freq: Once | INTRAVENOUS | Status: AC
  Administered 2019-01-22: 5 10*6.[IU] via INTRAVENOUS
  Filled 2019-01-22: qty 5

## 2019-01-22 MED ORDER — LACTATED RINGERS IV SOLN: 500.0000 mL | INTRAVENOUS | Status: DC | PRN

## 2019-01-22 MED ORDER — BETAMETHASONE SOD PHOS & ACET 6 (3-3) MG/ML IJ SUSP
12.0000 mg | Freq: Once | INTRAMUSCULAR | Status: AC
  Administered 2019-01-22: 12 mg via INTRAMUSCULAR
  Filled 2019-01-22: qty 2

## 2019-01-22 MED ORDER — OXYTOCIN 40 UNITS IN NORMAL SALINE INFUSION - SIMPLE MED: 2.5000 [IU]/h | INTRAVENOUS | Status: DC

## 2019-01-22 MED ORDER — LACTATED RINGERS IV SOLN
INTRAVENOUS | Status: DC
  Administered 2019-01-22: 21:00:00 via INTRAVENOUS

## 2019-01-22 MED ORDER — ACETAMINOPHEN 325 MG PO TABS
650.0000 mg | ORAL_TABLET | ORAL | Status: DC | PRN
  Administered 2019-01-23: 650 mg via ORAL
  Filled 2019-01-22: qty 2

## 2019-01-22 MED ORDER — MISOPROSTOL 50MCG HALF TABLET
50.0000 ug | ORAL_TABLET | Freq: Once | ORAL | Status: AC
  Administered 2019-01-22: 50 ug via BUCCAL

## 2019-01-22 MED ORDER — PENICILLIN G 3 MILLION UNITS IVPB - SIMPLE MED
3.0000 10*6.[IU] | INTRAVENOUS | Status: DC
  Administered 2019-01-23 (×5): 3 10*6.[IU] via INTRAVENOUS
  Filled 2019-01-22 (×5): qty 100

## 2019-01-22 MED ORDER — LACTATED RINGERS IV SOLN
INTRAVENOUS | Status: DC
  Administered 2019-01-23 (×2): via INTRAVENOUS

## 2019-01-22 MED ORDER — TERBUTALINE SULFATE 1 MG/ML IJ SOLN: 0.2500 mg | Freq: Once | INTRAMUSCULAR | Status: DC

## 2019-01-22 MED ORDER — AZITHROMYCIN 500 MG PO TABS
1000.0000 mg | ORAL_TABLET | Freq: Once | ORAL | Status: DC
  Filled 2019-01-22: qty 2

## 2019-01-22 MED ORDER — LACTATED RINGERS IV BOLUS
1000.0000 mL | Freq: Once | INTRAVENOUS | Status: AC
  Administered 2019-01-22: 1000 mL via INTRAVENOUS

## 2019-01-22 MED ORDER — OXYTOCIN BOLUS FROM INFUSION: 500.0000 mL | Freq: Once | INTRAVENOUS | Status: DC

## 2019-01-22 MED ORDER — SOD CITRATE-CITRIC ACID 500-334 MG/5ML PO SOLN: 30.0000 mL | ORAL | Status: DC | PRN

## 2019-01-22 NOTE — MAU Note (Signed)
Presents with poss ROM 1815 today. Lower abd/back pain 7/10. Mucousy discharge with "dark, red blood" present. Dec FM. Last IC-3-4 days ago.   Gilmer Mor RN

## 2019-01-22 NOTE — H&P (Addendum)
OBSTETRIC ADMISSION HISTORY AND PHYSICAL  Debbie Harrison is a 33 y.o. female G1P0 with IUP at 6142w6d by LMP and 1st trimester US presenting for leaking of fluid and found to have ruptured membranes in MAU. Patient reports she took a nap and woke up around 1800 and noticed clear wet fluid all around her that continued to leak when she went to the restroom. Also notice some spotting and small bloody mucous. Feeling some contractions infrequently that she is rating at 1/10 for pain. She reports +Fms, no blurry vision, headaches or peripheral edema, and RUQ pain.  She plans on breast feeding. She request POP's for birth control. [facility]   Dating: By LMP and 1st trimester US --->  Estimated Date of Delivery: 02/27/19  Sono:  Anatomy scan on 10/10/2018  @[redacted]w[redacted]d , CWD, normal anatomy, cephalic presentation, posterior placenta lie, 325g, 50% EFW Fetal Evaluation  Num Of Fetuses:         1  Fetal Heart Rate(bpm):  157  Cardiac Activity:       Observed  Presentation:           Cephalic  Placenta:               Posterior  P. Cord Insertion:      Visualized  Amniotic Fluid  AFI FV:      Within normal limits                              Largest Pocket(cm)                              3.58  Prenatal History/Complications: None  Past Medical History: Past Medical History:  Diagnosis Date  . Anemia   . Vaginal Pap smear, abnormal 2020    Past Surgical History: Past Surgical History:  Procedure Laterality Date  . breast lift    . LEEP    . wisdom teeth extraxction      Obstetrical History: OB History    Gravida  1   Para      Term      Preterm      AB      Living  0     SAB      TAB      Ectopic      Multiple      Live Births              Social History: Social History   Socioeconomic History  . Marital status: Married    Spouse name: Luisa Hartatrick  . Number of children: Not on file  . Years of education: Not on file  . Highest education  level: Not on file  Occupational History  . Not on file  Social Needs  . Financial resource strain: Not hard at all  . Food insecurity    Worry: Never true    Inability: Never true  . Transportation needs    Medical: No    Non-medical: No  Tobacco Use  . Smoking status: Never Smoker  . Smokeless tobacco: Never Used  Substance and Sexual Activity  . Alcohol use: Not Currently    Alcohol/week: 3.0 standard drinks    Types: 3 Glasses of wine per week    Frequency: Never  . Drug use: Never  . Sexual activity: Yes    Birth control/protection: None  Lifestyle  . Physical activity    Days per week: Patient refused    Minutes per session: Patient refused  . Stress: Only a little  Relationships  . Social Herbalist on phone: Patient refused    Gets together: Patient refused    Attends religious service: Patient refused    Active member of club or organization: Patient refused    Attends meetings of clubs or organizations: Patient refused    Relationship status: Patient refused  Other Topics Concern  . Not on file  Social History Narrative  . Not on file    Family History: Family History  Problem Relation Age of Onset  . Hypertension Father     Allergies: No Known Allergies  Medications Prior to Admission  Medication Sig Dispense Refill Last Dose  . Prenatal MV-Min-Fe Fum-FA-DHA (PRENATAL 1 PO) Take by mouth.   01/22/2019 at Unknown time     Review of Systems   All systems reviewed and negative except as stated in HPI  Blood pressure 116/66, pulse 100, temperature 98.4 F (36.9 C), temperature source Oral, resp. rate 17, height 5\' 7"  (1.702 m), weight 70.3 kg, last menstrual period 05/23/2018, SpO2 99 %. General appearance: alert, cooperative, appears stated age and no distress Lungs: normal effort Heart: regular rate  Abdomen: soft, non-tender; bowel sounds normal Pelvic: gravid uterus  Extremities: Homans sign is negative, no sign of  DVT Presentation: cephalic by BSUS Fetal monitoringBaseline: 135 bpm, Variability: Good {> 6 bpm), Accelerations: Reactive and Decelerations: Absent Uterine activityNone Dilation: Closed Effacement (%): Thick Exam by:: Dr. Marice Potter   Prenatal labs: ABO, Rh: --/--/A POS, A POS Performed at [facility], [place] [address], [place], [place] [address]  [phone]) Antibody: NEG (08/30 2040) Rubella: 1.54 (03/02 1105) RPR: Non Reactive (07/27 1124)  HBsAg: Negative (03/02 1105)  HIV: Non Reactive (07/27 1124)  GBS:    2 hr Glucola WNL Genetic screening  WNL Anatomy US WNL  Prenatal Transfer Tool  Maternal Diabetes: No Genetic Screening: Normal Maternal Ultrasounds/Referrals: Normal Fetal Ultrasounds or other Referrals:  None Maternal Substance Abuse:  No Significant Maternal Medications:  None Significant Maternal Lab Results: Other: Unknown due to preterm status   Results for orders placed or performed during the hospital encounter of 01/22/19 (from the past 24 hour(s))  Urinalysis, Routine w reflex microscopic   Collection Time: 01/22/19  7:23 PM  Result Value Ref Range   Color, Urine STRAW (A) YELLOW   APPearance HAZY (A) CLEAR   Specific Gravity, Urine 1.004 (L) 1.005 - 1.030   pH 6.0 5.0 - 8.0   Glucose, UA NEGATIVE NEGATIVE mg/dL   Hgb urine dipstick LARGE (A) NEGATIVE   Bilirubin Urine NEGATIVE NEGATIVE   Ketones, ur NEGATIVE NEGATIVE mg/dL   Protein, ur NEGATIVE NEGATIVE mg/dL   Nitrite NEGATIVE NEGATIVE   Leukocytes,Ua NEGATIVE NEGATIVE   RBC / HPF 0-5 0 - 5 RBC/hpf   WBC, UA 0-5 0 - 5 WBC/hpf   Bacteria, UA FEW (A) NONE SEEN   Squamous Epithelial / LPF 0-5 0 - 5   Hyaline Casts, UA PRESENT   Amnisure rupture of membrane (rom)not at [facility]   Collection Time: 01/22/19  7:37 PM  Result Value Ref Range   Amnisure ROM POSITIVE   Type and screen [place]   Collection Time: 01/22/19  8:40 PM  Result Value Ref Range   ABO/RH(D) A POS     Antibody Screen NEG  Sample Expiration      01/25/2019,2359 Performed at [facility], [address] [place], [place] [address]   ABO/Rh   Collection Time: 01/22/19  8:40 PM  Result Value Ref Range   ABO/RH(D)      A POS Performed at [facility], [address] [place], [place] [address]   SARS Coronavirus 2 [facility] order, Performed in [facility] hospital lab) Nasopharyngeal Nasopharyngeal Swab   Collection Time: 01/22/19  8:50 PM   Specimen: Nasopharyngeal Swab  Result Value Ref Range   SARS Coronavirus 2 NEGATIVE NEGATIVE  CBC   Collection Time: 01/22/19 11:39 PM  Result Value Ref Range   WBC 13.2 (H) 4.0 - 10.5 K/uL   RBC 3.97 3.87 - 5.11 MIL/uL   Hemoglobin 11.8 (L) 12.0 - 15.0 g/dL   HCT 09.8 (L) 11.9 - 14.7 %   MCV 86.1 80.0 - 100.0 fL   MCH 29.7 26.0 - 34.0 pg   MCHC 34.5 30.0 - 36.0 g/dL   RDW 82.9 56.2 - 13.0 %   Platelets 217 150 - 400 K/uL   nRBC 0.0 0.0 - 0.2 %    Patient Active Problem List   Diagnosis Date Noted  . Normal labor 01/22/2019  . Pregnancy 01/22/2019  . Supervision of normal first pregnancy, antepartum 07/25/2018  . Cervical abnormality in pregnancy, antepartum 07/25/2018    Assessment/Plan:  BETHINE HERMOSO is a 33 y.o. G1P0 at [redacted]w[redacted]d here for IOL secondary to preterm premature ROM.   #PPROM: s/p Betamethasone at 2107 on 8/30; could consider second dose if still pregnant in 24 hours; PCN started for GBS ppx at 2328 on 8/30 #Labor: Initial SVE closed/thick/high per MAU. Vertex by BSUS. Buccal Cytotec 50 mcg at 2320 on 8/31. Repeat SVE in 4 hours.  #Pain: PRN #FWB: Cat I; EFW: 2300g #ID:  PCN for GBS ppx due to preterm status #MOF: breast #MOC: oral contraception #Circ:  Inpatient circ   Jerilynn Birkenhead, MD [facility] Family Medicine Fellow, [facility] for [facility], [facility] 01/23/2019, 1:12 AM

## 2019-01-22 NOTE — MAU Provider Note (Signed)
First Provider Initiated Contact with Patient 01/22/19 Curly Rim     S: Ms. Debbie Harrison is a 33 y.o. G1P0 at [redacted]w[redacted]d  who presents to MAU today complaining of leaking of fluid since 1800 today. She endorses vaginal bleeding x 1 episode. She denies contractions. She reports normal fetal movement.    O: BP 119/82 (BP Location: Right Arm)   Pulse 92   Temp 98.5 F (36.9 C) (Oral)   Resp 16   LMP 05/23/2018 (Approximate)   SpO2 100% Comment: room air  GENERAL: Well-developed, well-nourished female in no acute distress.  HEAD: Normocephalic, atraumatic.  CHEST: Normal effort of breathing, regular heart rate ABDOMEN: Soft, nontender, gravid PELVIC: Normal external female genitalia. Vagina is pink and rugated. Cervix with normal contour, no lesions. Normal discharge.  Positive pooling. Scant amount of bloody show visible in vault  Cervical exam:  Dilation: Closed Effacement (%): Thick Cervical Position: Posterior Exam by:: Mallie Snooks CNM   Fetal Monitoring: Baseline: 145 Variability: Moderate Accelerations: Positive 15 x 15 accels Decelerations: N/A Contractions: Irregular q 2-4, palpate mild   Results for orders placed or performed during the hospital encounter of 01/22/19 (from the past 24 hour(s))  Urinalysis, Routine w reflex microscopic     Status: Abnormal   Collection Time: 01/22/19  7:23 PM  Result Value Ref Range   Color, Urine STRAW (A) YELLOW   APPearance HAZY (A) CLEAR   Specific Gravity, Urine 1.004 (L) 1.005 - 1.030   pH 6.0 5.0 - 8.0   Glucose, UA NEGATIVE NEGATIVE mg/dL   Hgb urine dipstick LARGE (A) NEGATIVE   Bilirubin Urine NEGATIVE NEGATIVE   Ketones, ur NEGATIVE NEGATIVE mg/dL   Protein, ur NEGATIVE NEGATIVE mg/dL   Nitrite NEGATIVE NEGATIVE   Leukocytes,Ua NEGATIVE NEGATIVE   RBC / HPF 0-5 0 - 5 RBC/hpf   WBC, UA 0-5 0 - 5 WBC/hpf   Bacteria, UA FEW (A) NONE SEEN   Squamous Epithelial / LPF 0-5 0 - 5   Hyaline Casts, UA PRESENT   Amnisure rupture  of membrane (rom)not at [facility]     Status: None   Collection Time: 01/22/19  7:37 PM  Result Value Ref Range   Amnisure ROM POSITIVE    A: SIUP at [redacted]w[redacted]d  Positive Amnisure SROM 1800 today Vertex by ultrasound Preterm admission approved by Dr. Barbaraann Rondo, Neonatology  P: Per Dr. Roselie Awkward, admit to L&D. Report called to Dr. Marice Potter  Boy (inpt circ)/breast/OCP  Mallie Snooks, [place] 01/22/2019 8:56 PM

## 2019-01-22 NOTE — Plan of Care (Signed)

## 2019-01-23 ENCOUNTER — Inpatient Hospital Stay (HOSPITAL_COMMUNITY): Payer: No Typology Code available for payment source | Admitting: Anesthesiology

## 2019-01-23 ENCOUNTER — Encounter (HOSPITAL_COMMUNITY): Payer: Self-pay | Admitting: *Deleted

## 2019-01-23 DIAGNOSIS — Z3482 Encounter for supervision of other normal pregnancy, second trimester: Secondary | ICD-10-CM | POA: Diagnosis not present

## 2019-01-23 DIAGNOSIS — Z3A35 35 weeks gestation of pregnancy: Secondary | ICD-10-CM

## 2019-01-23 DIAGNOSIS — Z3483 Encounter for supervision of other normal pregnancy, third trimester: Secondary | ICD-10-CM | POA: Diagnosis not present

## 2019-01-23 DIAGNOSIS — O42113 Preterm premature rupture of membranes, onset of labor more than 24 hours following rupture, third trimester: Secondary | ICD-10-CM

## 2019-01-23 LAB — RPR: RPR Ser Ql: NONREACTIVE

## 2019-01-23 LAB — ABO/RH: ABO/RH(D): A POS

## 2019-01-23 MED ORDER — FENTANYL-BUPIVACAINE-NACL 0.5-0.125-0.9 MG/250ML-% EP SOLN: 12.0000 mL/h | EPIDURAL | Status: DC | PRN

## 2019-01-23 MED ORDER — LACTATED RINGERS IV SOLN: 500.0000 mL | Freq: Once | INTRAVENOUS | Status: DC

## 2019-01-23 MED ORDER — FENTANYL CITRATE (PF) 100 MCG/2ML IJ SOLN
100.0000 ug | INTRAMUSCULAR | Status: DC | PRN
  Administered 2019-01-23: 100 ug via INTRAVENOUS
  Filled 2019-01-23: qty 2

## 2019-01-23 MED ORDER — TETANUS-DIPHTH-ACELL PERTUSSIS 5-2.5-18.5 LF-MCG/0.5 IM SUSP: 0.5000 mL | Freq: Once | INTRAMUSCULAR | Status: DC

## 2019-01-23 MED ORDER — COCONUT OIL OIL: 1.0000 "application " | TOPICAL_OIL | Status: DC | PRN

## 2019-01-23 MED ORDER — EPHEDRINE 5 MG/ML INJ: 10.0000 mg | INTRAVENOUS | Status: DC | PRN

## 2019-01-23 MED ORDER — ONDANSETRON HCL 4 MG/2ML IJ SOLN: 4.0000 mg | INTRAMUSCULAR | Status: DC | PRN

## 2019-01-23 MED ORDER — LIDOCAINE HCL (PF) 1 % IJ SOLN
INTRAMUSCULAR | Status: DC | PRN
  Administered 2019-01-23: 5 mL via EPIDURAL

## 2019-01-23 MED ORDER — ACETAMINOPHEN 325 MG PO TABS: 650.0000 mg | ORAL_TABLET | ORAL | Status: DC | PRN

## 2019-01-23 MED ORDER — IBUPROFEN 600 MG PO TABS
600.0000 mg | ORAL_TABLET | Freq: Four times a day (QID) | ORAL | Status: DC
  Administered 2019-01-24 – 2019-01-25 (×4): 600 mg via ORAL
  Filled 2019-01-23 (×7): qty 1

## 2019-01-23 MED ORDER — WITCH HAZEL-GLYCERIN EX PADS: 1.0000 "application " | MEDICATED_PAD | CUTANEOUS | Status: DC | PRN

## 2019-01-23 MED ORDER — FENTANYL-BUPIVACAINE-NACL 0.5-0.125-0.9 MG/250ML-% EP SOLN
12.0000 mL/h | EPIDURAL | Status: DC | PRN
  Filled 2019-01-23: qty 250

## 2019-01-23 MED ORDER — MISOPROSTOL 25 MCG QUARTER TABLET
ORAL_TABLET | ORAL | Status: AC
  Filled 2019-01-23: qty 1

## 2019-01-23 MED ORDER — PHENYLEPHRINE 40 MCG/ML (10ML) SYRINGE FOR IV PUSH (FOR BLOOD PRESSURE SUPPORT): 80.0000 ug | PREFILLED_SYRINGE | INTRAVENOUS | Status: DC | PRN

## 2019-01-23 MED ORDER — SENNOSIDES-DOCUSATE SODIUM 8.6-50 MG PO TABS
2.0000 | ORAL_TABLET | ORAL | Status: DC
  Administered 2019-01-23 – 2019-01-24 (×2): 2 via ORAL
  Filled 2019-01-23 (×2): qty 2

## 2019-01-23 MED ORDER — ZOLPIDEM TARTRATE 5 MG PO TABS: 5.0000 mg | ORAL_TABLET | Freq: Every evening | ORAL | Status: DC | PRN

## 2019-01-23 MED ORDER — DIPHENHYDRAMINE HCL 25 MG PO CAPS: 25.0000 mg | ORAL_CAPSULE | Freq: Four times a day (QID) | ORAL | Status: DC | PRN

## 2019-01-23 MED ORDER — DIBUCAINE (PERIANAL) 1 % EX OINT: 1.0000 "application " | TOPICAL_OINTMENT | CUTANEOUS | Status: DC | PRN

## 2019-01-23 MED ORDER — OXYTOCIN 40 UNITS IN NORMAL SALINE INFUSION - SIMPLE MED
1.0000 m[IU]/min | INTRAVENOUS | Status: DC
  Administered 2019-01-23: 2 m[IU]/min via INTRAVENOUS
  Filled 2019-01-23: qty 1000

## 2019-01-23 MED ORDER — PRENATAL MULTIVITAMIN CH
1.0000 | ORAL_TABLET | Freq: Every day | ORAL | Status: DC
  Administered 2019-01-24 – 2019-01-25 (×2): 1 via ORAL
  Filled 2019-01-23 (×2): qty 1

## 2019-01-23 MED ORDER — DIPHENHYDRAMINE HCL 50 MG/ML IJ SOLN: 12.5000 mg | INTRAMUSCULAR | Status: DC | PRN

## 2019-01-23 MED ORDER — FENTANYL CITRATE (PF) 100 MCG/2ML IJ SOLN
INTRAMUSCULAR | Status: AC
  Administered 2019-01-23: 100 ug
  Filled 2019-01-23: qty 2

## 2019-01-23 MED ORDER — SODIUM CHLORIDE (PF) 0.9 % IJ SOLN
INTRAMUSCULAR | Status: DC | PRN
  Administered 2019-01-23: 12 mL/h via EPIDURAL

## 2019-01-23 MED ORDER — MISOPROSTOL 50MCG HALF TABLET: 50.0000 ug | ORAL_TABLET | ORAL | Status: DC

## 2019-01-23 MED ORDER — SIMETHICONE 80 MG PO CHEW: 80.0000 mg | CHEWABLE_TABLET | ORAL | Status: DC | PRN

## 2019-01-23 MED ORDER — ONDANSETRON HCL 4 MG PO TABS: 4.0000 mg | ORAL_TABLET | ORAL | Status: DC | PRN

## 2019-01-23 MED ORDER — PHENYLEPHRINE 40 MCG/ML (10ML) SYRINGE FOR IV PUSH (FOR BLOOD PRESSURE SUPPORT)
80.0000 ug | PREFILLED_SYRINGE | INTRAVENOUS | Status: DC | PRN
  Filled 2019-01-23: qty 10

## 2019-01-23 MED ORDER — MISOPROSTOL 25 MCG QUARTER TABLET
25.0000 ug | ORAL_TABLET | Freq: Once | ORAL | Status: AC
  Administered 2019-01-23: 25 ug via VAGINAL

## 2019-01-23 MED ORDER — BENZOCAINE-MENTHOL 20-0.5 % EX AERO
1.0000 "application " | INHALATION_SPRAY | CUTANEOUS | Status: DC | PRN
  Filled 2019-01-23: qty 56

## 2019-01-23 NOTE — Progress Notes (Signed)
Labor Progress Note Debbie Harrison is a 33 y.o. G1P0 at [redacted]w[redacted]d presented for IOL secondary to PPROM.  S: Resting and comfortable now but reported some contractions earlier.  O:  BP 110/64   Pulse 76   Temp 98.4 F (36.9 C) (Oral)   Resp 18   Ht 5\' 7"  (1.702 m)   Wt 70.3 kg   LMP 05/23/2018 (Approximate)   SpO2 99% Comment: room air   BMI 24.28 kg/m  EFM: 140, moderate variability, reactive, pos accels, no decels Ctx: None currently   CVE: Dilation: 1 Effacement (%): 50 Cervical Position: Posterior Station: -1 Presentation: Vertex Exam by:: Dr. Marice Potter   A&P: 33 y.o. G1P0 [redacted]w[redacted]d here for IOL secondary to PPROM. #PPROM: s/p Betamethasone at 2107 on 8/30; could consider second dose if still pregnant in 24 hours; PCN started for GBS ppx at 2328 on 8/30 #Labor: Initial SVE closed/thick/high per MAU. Vertex by BSUS on admission. Buccal Cytotec 50 mcg at 2320 on 8/31. Foley bulb placed at 0400 along with vaginal Cytotec. FB with blood return in catheter.  #Pain:  PRN #FWB: Cat I; EFW: 2300g #ID:      PCN for GBS ppx due to preterm status  Chauncey Mann, MD 4:12 AM

## 2019-01-23 NOTE — Discharge Summary (Addendum)
Postpartum Discharge Summary     Patient Name: Debbie Harrison DOB: 1986/01/14 MRN: [id number]  Date of admission: 01/22/2019 Delivering Provider: Lajean Manes   Date of discharge: 01/25/2019  Admitting diagnosis: water broke bloody discharge Intrauterine pregnancy: [redacted]w[redacted]d    Secondary diagnosis:  Active Problems:   Preterm premature rupture of membranes (PPROM) delivered, current hospitalization   Pregnancy   SVD (spontaneous vaginal delivery)  Additional problems: none     Discharge diagnosis: Preterm Pregnancy Delivered                                                                                                Post partum procedures:none  Augmentation: Pitocin, Cytotec and Foley Balloon  Complications: RJSH>70hours  Hospital course:  Induction of Labor With Vaginal Delivery   33y.o. yo G1P0 at 334w0das admitted to the hospital 01/22/2019 for induction of labor.  Indication for induction: PPROM.  Patient had an uncomplicated labor course as follows: Membrane Rupture Time/Date: 2:43 PM ,01/23/2019   Intrapartum Procedures: Episiotomy: None [1]                                         Lacerations:  1st degree [2]  Patient had delivery of a Viable infant.  Information for the patient's newborn:  JaMarabella, Popiel0[[id number]]Delivery Method: Vaginal, Spontaneous(Filed from Delivery Summary)    01/23/2019  Details of delivery can be found in separate delivery note.  Patient had a routine postpartum course. Patient is discharged home 01/25/19. Delivery time: 8:19 PM   Magnesium Sulfate received: No BMZ received: Yes Rhophylac:N/A MMR:No Transfusion:No  Physical exam  Vitals:   01/24/19 0800 01/24/19 1145 01/24/19 1259 01/25/19 0545  BP: 111/87 120/82 105/72 119/88  Pulse: 74 69 78 83  Resp: 16 16  17   Temp: 98 F (36.7 C) 98.2 F (36.8 C) 98.4 F (36.9 C) 98.2 F (36.8 C)  TempSrc: Oral Oral Oral Oral  SpO2: 97%   98%  Weight:      Height:        General: alert, cooperative and no distress  Cardiac: RRR, no rubs or gallops, normal cap refill\ Pulm: lungs clear on ausc, no crackles or wheeze, no resp distress Abdo: Abdo soft non tender, bowel sounds present Lochia: appropriate Uterine Fundus: firm Incision: N/A DVT Evaluation: No evidence of DVT seen on physical exam. No cords or calf tenderness. No significant calf/ankle edema. Labs: Lab Results  Component Value Date   WBC 13.2 (H) 01/22/2019   HGB 11.8 (L) 01/22/2019   HCT 34.2 (L) 01/22/2019   MCV 86.1 01/22/2019   PLT 217 01/22/2019   No flowsheet data found.  Discharge instruction: per After Visit Summary and "Baby and Me Booklet".  After visit meds:  Allergies as of 01/25/2019   No Known Allergies     Medication List    TAKE these medications   acetaminophen 325 MG tablet Commonly known as: Tylenol Take 2 tablets (650 mg total)  by mouth every 4 (four) hours as needed (for pain scale < 4).   calcium carbonate 750 MG chewable tablet Commonly known as: TUMS EX Chew 1 tablet by mouth daily as needed for heartburn.   ibuprofen 600 MG tablet Commonly known as: ADVIL Take 1 tablet (600 mg total) by mouth every 6 (six) hours.   polyethylene glycol 17 g packet Commonly known as: MIRALAX / GLYCOLAX Take 17 g by mouth daily.   PRENATAL 1 PO Take 1 tablet by mouth daily.       Diet: routine diet  Activity: Advance as tolerated. Pelvic rest for 6 weeks.   Outpatient follow up:4 weeks Follow up Appt: No future appointments. Follow up Visit:  Please schedule this patient for Postpartum visit in: 4 weeks with the following provider: Any provider For C/S patients schedule nurse incision check in weeks 2 weeks: no Low risk pregnancy complicated by: nothing Delivery mode:  SVD Anticipated Birth Control:  POPs PP Procedures needed: none  Schedule Integrated BH visit: no   Newborn Data: Live born female  Birth Weight:  2566g APGAR: 7,8   Newborn  Delivery   Birth date/time: 01/23/2019 20:19:00 Delivery type:       Baby Feeding: Bottle and Breast Disposition:home with mother   01/25/2019 Lattie Haw, MD  PGY-1, [facility]   I saw and evaluated the patient. I agree with the findings and the plan of care as documented in the resident's note. Circumcision on baby done today. Patient would like to f/u outpatient for POP's. Vitals stable.   Barrington Ellison, MD [facility] Family Medicine Fellow, [facility] for [employer], [place]

## 2019-01-23 NOTE — Progress Notes (Signed)
Pt up to bathroom 0736 to 0745 fhr rate tracing not dependable

## 2019-01-23 NOTE — Anesthesia Procedure Notes (Signed)
Epidural Patient location during procedure: OB Start time: 01/23/2019 3:48 PM End time: 01/23/2019 4:01 PM  Staffing Anesthesiologist: Barnet Glasgow, MD Performed: anesthesiologist   Preanesthetic Checklist Completed: patient identified, site marked, surgical consent, pre-op evaluation, timeout performed, IV checked, risks and benefits discussed and monitors and equipment checked  Epidural Patient position: sitting Prep: site prepped and draped and DuraPrep Patient monitoring: continuous pulse ox and blood pressure Approach: midline Location: L3-L4 Injection technique: LOR air  Needle:  Needle type: Tuohy  Needle gauge: 17 G Needle length: 9 cm and 9 Needle insertion depth: 6 cm Catheter type: closed end flexible Catheter size: 19 Gauge Catheter at skin depth: 11 cm Test dose: negative  Assessment Events: blood not aspirated, injection not painful, no injection resistance, negative IV test and no paresthesia  Additional Notes Patient identified. Risks/Benefits/Options discussed with patient including but not limited to bleeding, infection, nerve damage, paralysis, failed block, incomplete pain control, headache, blood pressure changes, nausea, vomiting, reactions to medication both or allergic, itching and postpartum back pain. Confirmed with bedside nurse the patient's most recent platelet count. Confirmed with patient that they are not currently taking any anticoagulation, have any bleeding history or any family history of bleeding disorders. Patient expressed understanding and wished to proceed. All questions were answered. Sterile technique was used throughout the entire procedure. Please see nursing notes for vital signs. Test dose was given through epidural needle and negative prior to continuing to dose epidural or start infusion. Warning signs of high block given to the patient including shortness of breath, tingling/numbness in hands, complete motor block, or any  concerning symptoms with instructions to call for help. Patient was given instructions on fall risk and not to get out of bed. All questions and concerns addressed with instructions to call with any issues. 1 Attempt (S) . Patient tolerated procedure well.

## 2019-01-23 NOTE — Progress Notes (Signed)
LABOR PROGRESS NOTE  Debbie Harrison is a 33 y.o. G1P0 at [redacted]w[redacted]d  admitted for IOL for PPROM  Subjective: Patient doing well, reports occasional contractions, patient resting during contractions- does not want pain medication at this time   Objective: BP 120/71   Pulse 84   Temp 98.6 F (37 C) (Oral)   Resp 16   Ht 5\' 7"  (1.702 m)   Wt 70.3 kg   LMP 05/23/2018 (Approximate)   SpO2 99%   BMI 24.28 kg/m  or  Vitals:   01/23/19 0700 01/23/19 0806 01/23/19 0901 01/23/19 0953  BP:  (!) 97/59 105/62 120/71  Pulse:  79 78 84  Resp:  16 20 16   Temp:  98.6 F (37 C)    TempSrc:  Oral    SpO2: 99%     Weight:      Height:        FB out @ 0910 Dilation: 4.5 Effacement (%): 80 Cervical Position: Posterior Station: -2 Presentation: Vertex Exam by:: Wende Bushy CNM FHT: baseline rate 140, moderate varibility, +accel, variable decel  Toco: 1-4/ mild by palpation   Labs: Lab Results  Component Value Date   WBC 13.2 (H) 01/22/2019   HGB 11.8 (L) 01/22/2019   HCT 34.2 (L) 01/22/2019   MCV 86.1 01/22/2019   PLT 217 01/22/2019    Patient Active Problem List   Diagnosis Date Noted  . Normal labor 01/22/2019  . Pregnancy 01/22/2019  . Supervision of normal first pregnancy, antepartum 07/25/2018  . Cervical abnormality in pregnancy, antepartum 07/25/2018    Assessment / Plan: 34 y.o. G1P0 at [redacted]w[redacted]d here for IOL for PPROM   Labor: FB out, discussed plan of care with patient and initiation of pitocin, forebag felt Fetal Wellbeing:  Cat II  Pain Control:  Pain medication ordered PRN  Anticipated MOD:  SVD  Lajean Manes, CNM 01/23/2019, 9:55 AM

## 2019-01-23 NOTE — Progress Notes (Signed)
Discuss administration of Cytotec at 0800 as scheduled.  Will wait until pt can be evaluated by provider

## 2019-01-23 NOTE — Progress Notes (Signed)
LABOR PROGRESS NOTE  Debbie Harrison is a 33 y.o. G1P0 at [redacted]w[redacted]d  admitted for IOL for PPROM.  Subjective: She is doing well, laying in back and resting. States she is not feeling contractions and has been mostly sleeping today. Does not want pain medications at this time.  Objective: BP 119/77   Pulse 97   Temp 98.5 F (36.9 C) (Oral)   Resp 20   Ht 5\' 7"  (1.702 m)   Wt 70.3 kg   LMP 05/23/2018 (Approximate)   SpO2 99%   BMI 24.28 kg/m  or  Vitals:   01/23/19 1315 01/23/19 1336 01/23/19 1355 01/23/19 1400  BP:  119/77    Pulse:  97    Resp:      Temp:      TempSrc:      SpO2: 98%  99% 99%  Weight:      Height:       Dilation: 4.5 Effacement (%): 80 Cervical Position: Posterior Station: -2 Presentation: Vertex Exam by:: rogers,cnm FHT: baseline rate 135-140, moderate varibility, 15x15 accelerations present, some variable decel, cat II strip Toco: occasional but irregular  Labs: Lab Results  Component Value Date   WBC 13.2 (H) 01/22/2019   HGB 11.8 (L) 01/22/2019   HCT 34.2 (L) 01/22/2019   MCV 86.1 01/22/2019   PLT 217 01/22/2019    Patient Active Problem List   Diagnosis Date Noted  . Normal labor 01/22/2019  . Pregnancy 01/22/2019  . Supervision of normal first pregnancy, antepartum 07/25/2018  . Cervical abnormality in pregnancy, antepartum 07/25/2018    Assessment / Plan: 33 y.o. G1P0 at [redacted]w[redacted]d here for IOL for PPROM.  Labor: AROM of forebag. Pitocin at 81mU/min. Continue titrating pitocin. Fetal Wellbeing:  Cat II strip, reassuring with no indications for intervention at this time Pain Control:  PRN as desired Anticipated MOD:  SVD  Vanessa Kick [facility] Medical Student 01/23/2019, 2:45 PM

## 2019-01-23 NOTE — Anesthesia Preprocedure Evaluation (Signed)
Anesthesia Evaluation  Patient identified by MRN, date of birth, ID band Patient awake    Reviewed: Allergy & Precautions, H&P , NPO status , Patient's Chart, lab work & pertinent test results  Airway Mallampati: II  TM Distance: >3 FB Neck ROM: Full    Dental no notable dental hx. (+) Teeth Intact   Pulmonary neg pulmonary ROS,    Pulmonary exam normal breath sounds clear to auscultation       Cardiovascular negative cardio ROS Normal cardiovascular exam Rhythm:Regular Rate:Normal     Neuro/Psych negative neurological ROS  negative psych ROS   GI/Hepatic   Endo/Other    Renal/GU      Musculoskeletal   Abdominal   Peds  Hematology negative hematology ROS (+)   Anesthesia Other Findings   Reproductive/Obstetrics (+) Pregnancy                             Lab Results  Component Value Date   WBC 13.2 (H) 01/22/2019   HGB 11.8 (L) 01/22/2019   HCT 34.2 (L) 01/22/2019   MCV 86.1 01/22/2019   PLT 217 01/22/2019    Anesthesia Physical Anesthesia Plan  ASA: II  Anesthesia Plan: Epidural   Post-op Pain Management:    Induction:   PONV Risk Score and Plan:   Airway Management Planned:   Additional Equipment:   Intra-op Plan:   Post-operative Plan:   Informed Consent: I have reviewed the patients History and Physical, chart, labs and discussed the procedure including the risks, benefits and alternatives for the proposed anesthesia with the patient or authorized representative who has indicated his/her understanding and acceptance.       Plan Discussed with:   Anesthesia Plan Comments: (G1P0 for LEA)        Anesthesia Quick Evaluation

## 2019-01-24 NOTE — Lactation Note (Addendum)
This note was copied from a baby's chart. Initial visit at 16 hours of life. Mom is a P1 who reports + breast changes w/pregnancy. She has been doing a combination of hand expressing & pumping to express her milk to feed "Debbie Harrison." Mom reports feeling comfortable when pumping.   Mom understands that she can attempt latch to familiarize infant with breast, but infant may not be interested in latching at this time. Mom understands that Debbie Harrison will likely be able to effectively feed at the breast as he gets closer to his due date. Parents are pleased that they are now able to use the bottle instead of a spoon to feed infant. Parents have volume guidelines with emphasis to allow infant to eat more than volumes listed, if he cues to do so.   Parents' questions were answered to their satisfaction. Mom has a Spectra pump at home. 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  [name] [facility] 01/24/2019, 1:00 PM

## 2019-01-24 NOTE — Anesthesia Postprocedure Evaluation (Signed)
Anesthesia Post Note  Patient: Debbie Harrison  Procedure(s) Performed: AN AD HOC LABOR EPIDURAL     Patient location during evaluation: Mother Baby Anesthesia Type: Epidural Level of consciousness: awake and alert Pain management: pain level controlled Vital Signs Assessment: post-procedure vital signs reviewed and stable Respiratory status: spontaneous breathing, nonlabored ventilation and respiratory function stable Cardiovascular status: stable Postop Assessment: no headache, no backache, epidural receding, no apparent nausea or vomiting, patient able to bend at knees, adequate PO intake and able to ambulate Anesthetic complications: no    Last Vitals:  Vitals:   01/23/19 2341 01/24/19 0400  BP: 112/62 124/73  Pulse: 75 73  Resp: 18 19  Temp: 36.9 C 36.9 C  SpO2: 99% 98%    Last Pain:  Vitals:   01/24/19 0550  TempSrc:   PainSc: 1    Pain Goal:                   Jabier Mutton

## 2019-01-24 NOTE — Progress Notes (Signed)
POSTPARTUM PROGRESS NOTE  Post Partum Day 2  Subjective:  Debbie Harrison is a 33 y.o. G1P0101 s/p induced vaginal delivery for PPROM at [redacted]w[redacted]d.  She reports she is doing well. No acute events overnight. She denies any problems with ambulating, voiding or po intake. Denies nausea or vomiting.  Pain is well controlled - denies pain or cramping.  Lochia is appropriate.  Objective: Blood pressure 124/73, pulse 73, temperature 98.4 F (36.9 C), resp. rate 19, height 5\' 7"  (1.702 m), weight 70.3 kg, last menstrual period 05/23/2018, SpO2 98 %, unknown if currently breastfeeding.  Physical Exam:  General: alert, cooperative and no distress Chest: no respiratory distress. CTAB Heart:regular rate and rythm, distal pulses intact Abdomen: soft, nontender,  Uterine Fundus: firm, appropriately tender DVT Evaluation: No calf swelling or tenderness Extremities: no edema Skin: warm, dry  Recent Labs    01/22/19 2339  HGB 11.8*  HCT 34.2*    Assessment/Plan: Debbie Harrison is a 33 y.o. G1P0101 s/p induced vaginal delivery for PPROM at [redacted]w[redacted]d   PPD#1 - Doing well  Routine postpartum care Contraception: POPs Feeding: breast Dispo: Plan for discharge 9/2.   LOS: 2 days   [place] Student 01/24/2019, 7:16 AM

## 2019-01-25 MED ORDER — POLYETHYLENE GLYCOL 3350 17 G PO PACK: 17.0000 g | PACK | Freq: Every day | ORAL | Status: DC

## 2019-01-25 MED ORDER — IBUPROFEN 600 MG PO TABS: 600.0000 mg | ORAL_TABLET | Freq: Four times a day (QID) | ORAL | 0 refills | Status: DC

## 2019-01-25 MED ORDER — ACETAMINOPHEN 325 MG PO TABS: 650.0000 mg | ORAL_TABLET | ORAL | Status: DC | PRN

## 2019-01-25 MED ORDER — POLYETHYLENE GLYCOL 3350 17 G PO PACK: 17.0000 g | PACK | Freq: Every day | ORAL | 0 refills | Status: DC

## 2019-01-25 NOTE — Lactation Note (Signed)
This note was copied from a baby's chart. Lactation Consultation Note  Patient Name: Debbie Harrison Today's Date: 01/25/2019  P1, 51 hour LPTI female, weight loss -2%. Per mom she made few attempts to latch infant to breast. She has mostly  been pumping every 3 hours and giving infant EBM and 22 kcal  Similac Neosure with iron 15 ml per feeding. Per mom, she feed infant less than one hour, LC is unable to assess latch at this time. Mom will continue to do STS. Mom knows to breastfeed according hunger cues, 8 to 12 times within 24 hours and not make baby wait to feed past 3 hours. Mom will combine EBM with formula and feed to infant using slow flow bottle nipple. Mom knows to call Nurse or [facility] if she needs assistance latch infant to breast.    Maternal Data    Feeding Feeding Type: Bottle Fed - Formula Nipple Type: Slow - flow  LATCH Score                   Interventions    Lactation Tools Discussed/Used     Consult Status      Vicente Serene 01/25/2019, 12:09 AM

## 2019-01-25 NOTE — Lactation Note (Signed)
This note was copied from a baby's chart. Lactation Consultation Note  Patient Name: Debbie Harrison Date: 01/25/2019 Reason for consult: Follow-up assessment;Infant < 6lbs;Late-preterm 34-36.6wks Baby is 37 hours old/8% weight loss.  Baby is currently in the nursery for circumcision.  Mom states she puts baby to breast with cues but baby is not latching.  She then pumps and bottles feeds expressed milk and 22 calorie formula.  Baby is taking 5-15 mls at each feeding.  Mom feels like baby is doing well with bottle.  She is expressing 5-10 mls.  Instructed to increase volume to 20 mls today.  Discussed milk coming to volume and the prevention and treatment of engorgement.  Mom has a breast pump at home.  Reassured her that baby should progress with breastfeeding as he reaches term.  Recommended outpatient services.  Maternal Data    Feeding Feeding Type: Formula  LATCH Score                   Interventions    Lactation Tools Discussed/Used     Consult Status Consult Status: Follow-up Date: 01/26/19 Follow-up type: In-patient    Ave Filter 01/25/2019, 9:59 AM

## 2019-01-26 ENCOUNTER — Ambulatory Visit: Payer: Self-pay

## 2019-01-26 NOTE — Lactation Note (Signed)
This note was copied from a baby's chart. Lactation Consultation Note  Patient Name: Debbie Harrison [id number] Date: 01/26/2019 Reason for consult: Follow-up assessment;Late-preterm 34-36.6wks;Infant < 6lbs;Hyperbilirubinemia Baby is 62 hours/10% weight loss(2% more than yesterday).  Bili was 15.4 and phototherapy started this morning.  Mom is pumping every 3 hours and obtaining 15 mls.  Breasts are comfortable.  Baby is taking 20-30 mls of expressed milk/neosure every 3 hours.  No questions.  Encouraged to call out with concerns/assist.  Maternal Data    Feeding Feeding Type: Breast Milk Nipple Type: Slow - flow  LATCH Score                   Interventions    Lactation Tools Discussed/Used     Consult Status Consult Status: Follow-up Date: 01/27/19 Follow-up type: In-patient    Ave Filter 01/26/2019, 10:49 AM

## 2019-01-27 ENCOUNTER — Ambulatory Visit: Payer: Self-pay

## 2019-01-27 NOTE — Lactation Note (Signed)
This note was copied from a baby's chart. Lactation Consultation Note  Patient Name: Debbie Harrison YIFOY'D Date: 01/27/2019 Reason for consult: Follow-up assessment;Late-preterm 34-36.6wks;Infant < 6lbs   Baby 24 hours old.  < 6 lbs. Born at [redacted]w[redacted]d and on phototherapy. Mother ending pumping session as LC walked in room and pumped 27 ml. Mother asked if she could try breastfeeding. Mother has large diameter nipples and baby would not achieve needed depth. Applied #24NS and prefilled with breastmilk. Baby latched but only few sucks were noted and baby fell back asleep.  Encouraged mother to try occasionally for 10 min at the breast and if he does not achieve enough depth and sucking pattern to stop and give him his bottle of breastmilk.  He will progress as he growns.   Limit feedings to 30 min.  Encouraged mother to consider OP appt. Mother plans to continue pumping minimum of 8 times a day.  Encouraged STS.     Maternal Data    Feeding Feeding Type: Breast Fed Nipple Type: Slow - flow  LATCH Score Latch: Too sleepy or reluctant, no latch achieved, no sucking elicited.  Audible Swallowing: None  Type of Nipple: Everted at rest and after stimulation  Comfort (Breast/Nipple): Soft / non-tender  Hold (Positioning): Assistance needed to correctly position infant at breast and maintain latch.  LATCH Score: 5  Interventions Interventions: Breast feeding basics reviewed;Assisted with latch;DEBP  Lactation Tools Discussed/Used Tools: Nipple Shields Nipple shield size: 24 Breast pump type: Double-Electric Breast Pump   Consult Status Consult Status: Follow-up Date: 01/28/19 Follow-up type: In-patient    Debbie Harrison [facility] 01/27/2019, 9:36 AM

## 2019-01-31 ENCOUNTER — Encounter: Payer: 59 | Admitting: Advanced Practice Midwife

## 2019-02-27 ENCOUNTER — Inpatient Hospital Stay (HOSPITAL_COMMUNITY): Admission: RE | Admit: 2019-02-27 | Payer: 59 | Source: Home / Self Care

## 2019-03-01 ENCOUNTER — Other Ambulatory Visit: Payer: Self-pay

## 2019-03-01 ENCOUNTER — Encounter: Payer: Self-pay | Admitting: Obstetrics & Gynecology

## 2019-03-01 ENCOUNTER — Ambulatory Visit (INDEPENDENT_AMBULATORY_CARE_PROVIDER_SITE_OTHER): Payer: 59 | Admitting: Obstetrics & Gynecology

## 2019-03-01 DIAGNOSIS — Z3009 Encounter for other general counseling and advice on contraception: Secondary | ICD-10-CM

## 2019-03-01 DIAGNOSIS — O9270 Unspecified disorders of lactation: Secondary | ICD-10-CM

## 2019-03-01 DIAGNOSIS — Z3202 Encounter for pregnancy test, result negative: Secondary | ICD-10-CM | POA: Diagnosis not present

## 2019-03-01 DIAGNOSIS — Z23 Encounter for immunization: Secondary | ICD-10-CM | POA: Diagnosis not present

## 2019-03-01 LAB — POCT URINE PREGNANCY: Preg Test, Ur: NEGATIVE

## 2019-03-01 MED ORDER — METOCLOPRAMIDE HCL 5 MG PO TABS: 5.0000 mg | ORAL_TABLET | Freq: Four times a day (QID) | ORAL | 1 refills | Status: DC

## 2019-03-01 MED ORDER — NORETHINDRONE 0.35 MG PO TABS: 1.0000 | ORAL_TABLET | Freq: Every day | ORAL | 11 refills | Status: DC

## 2019-03-01 NOTE — Progress Notes (Signed)
Subjective:     Debbie Harrison is a 33 y.o. female who presents for a postpartum visit. She is 5 weeks postpartum following a spontaneous vaginal delivery. I have fully reviewed the prenatal and intrapartum course. The delivery was at 35 gestational weeks. Outcome: spontaneous vaginal delivery. Anesthesia: epidural. Postpartum course has been complicated by decreased milk production. Pt has seen Science writer and . Baby's course has been uncomplicated- adequate weight gain. aby is feeding by breast. Bleeding no bleeding. Bowel function is normal. Bladder function is normal. Patient is not sexually active. Contraception method is OCP (estrogen/progesterone). Postpartum depression screening: negative.  The following portions of the patient's history were reviewed and updated as appropriate: allergies, current medications, past family history, past medical history, past social history, past surgical history and problem list.  Review of Systems Pertinent items are noted in HPI.   Objective:  BP 120/81   Pulse 76   Ht 5\' 7"  (1.702 m)   Wt 143 lb 1.3 oz (64.9 kg)   Breastfeeding Yes   BMI 22.41 kg/m   CONSTITUTIONAL: Well-developed, well-nourished female in no acute distress.  HENT:  Normocephalic, atraumatic EYES: Conjunctivae and EOM are normal. No scleral icterus.  NECK: Normal range of motion SKIN: Skin is warm and dry. No rash noted. Not diaphoretic.No pallor. Debbie Harrison: Alert and oriented to person, place, and time. Normal coordination.   Assessment:    5 weeks postpartum exam. Pap smear not done at today's visit.   Decreased milk prod. Pt would like to begin Reglan. Currently taking Fenugreek. Contraception counseling- POPs requested.     Plan:    1. Contraception: oral progesterone-only contraceptive 2. Reglan 5mg  qid. If no imporvemen tin 2 weeks will increase to 10mg  qid.  3. Follow up in: 2 weeks or as needed.   4. Flu shot today  Debbie Harrison L. Harraway-Smith, M.D.,  Cherlynn June

## 2019-03-16 ENCOUNTER — Ambulatory Visit: Payer: 59 | Admitting: Family Medicine

## 2019-03-23 ENCOUNTER — Other Ambulatory Visit: Payer: Self-pay

## 2019-03-23 ENCOUNTER — Ambulatory Visit (INDEPENDENT_AMBULATORY_CARE_PROVIDER_SITE_OTHER): Payer: 59 | Admitting: Family Medicine

## 2019-03-23 DIAGNOSIS — O9270 Unspecified disorders of lactation: Secondary | ICD-10-CM | POA: Diagnosis not present

## 2019-03-23 MED ORDER — METOCLOPRAMIDE HCL 10 MG PO TABS: 10.0000 mg | ORAL_TABLET | Freq: Four times a day (QID) | ORAL | 3 refills | Status: DC

## 2019-03-23 NOTE — Progress Notes (Signed)
    TELEHEALTH GYNECOLOGY VIRTUAL VIDEO VISIT ENCOUNTER NOTE  Provider location: [facility] [place] [facility]   I connected with Blane Ohara on 03/23/19 at  9:45 AM EDT by WebEx Video Encounter at home and verified that I am speaking with the correct person using two identifiers.   I discussed the limitations, risks, security and privacy concerns of performing an evaluation and management service virtually and the availability of in person appointments. I also discussed with the patient that there may be a patient responsible charge related to this service. The patient expressed understanding and agreed to proceed.   History:  Debbie Harrison is a 33 y.o. G74P0101 female being evaluated today for decreased lactation.  She was seen 2 to 3 weeks ago and was prescribed Reglan.  She finds that this is a little helpful.  She has stopped taking fenugreek.  She is still supplementing 2 to 4 ounces a day with formula, but would like to stop the supplementation if possible. She denies any abnormal vaginal discharge, bleeding, pelvic pain or other concerns.       Past Medical History:  Diagnosis Date  . Anemia   . Vaginal Pap smear, abnormal 2020   Past Surgical History:  Procedure Laterality Date  . breast lift    . LEEP    . wisdom teeth extraxction     The following portions of the patient's history were reviewed and updated as appropriate: allergies, current medications, past family history, past medical history, past social history, past surgical history and problem list.    Review of Systems:  Pertinent items noted in HPI and remainder of comprehensive ROS otherwise negative.  Physical Exam:   General:  Alert, oriented and cooperative. Patient appears to be in no acute distress.  Mental Status: Normal mood and affect. Normal behavior. Normal judgment and thought content.   Respiratory: Normal respiratory effort, no problems with respiration noted  Rest of  physical exam deferred due to type of encounter  Labs and Imaging No results found for this or any previous visit (from the past 336 hour(s)). No results found.     Assessment and Plan:     1. Lactation problem We will increase Reglan to 10 mg 4 times a day.  We will follow-up with patient in 3 to 4 weeks to see if this is effective - metoCLOPramide (REGLAN) 10 MG tablet; Take 1 tablet (10 mg total) by mouth 4 (four) times daily.  Dispense: 120 tablet; Refill: 3       I discussed the assessment and treatment plan with the patient. The patient was provided an opportunity to ask questions and all were answered. The patient agreed with the plan and demonstrated an understanding of the instructions.   The patient was advised to call back or seek an in-person evaluation/go to the ED if the symptoms worsen or if the condition fails to improve as anticipated.  I provided 10 minutes of face-to-face time during this encounter.   [place] for [employer], [place]

## 2019-03-23 NOTE — Progress Notes (Signed)
Webex visit agree to visit for follow up on reglan and use for milk production. Kathrene Alu RN

## 2020-01-24 ENCOUNTER — Inpatient Hospital Stay (HOSPITAL_COMMUNITY)
Admission: AD | Admit: 2020-01-24 | Discharge: 2020-01-24 | Disposition: A | Discharge status: Home / Self Care | Payer: BC Managed Care – PPO | Attending: Obstetrics and Gynecology | Admitting: Obstetrics and Gynecology

## 2020-01-24 ENCOUNTER — Encounter (HOSPITAL_COMMUNITY): Payer: Self-pay | Admitting: Obstetrics and Gynecology

## 2020-01-24 ENCOUNTER — Other Ambulatory Visit: Payer: Self-pay

## 2020-01-24 ENCOUNTER — Inpatient Hospital Stay (HOSPITAL_COMMUNITY): Payer: BC Managed Care – PPO

## 2020-01-24 ENCOUNTER — Telehealth: Payer: Self-pay

## 2020-01-24 DIAGNOSIS — R9389 Abnormal findings on diagnostic imaging of other specified body structures: Secondary | ICD-10-CM | POA: Diagnosis not present

## 2020-01-24 DIAGNOSIS — O3680X0 Pregnancy with inconclusive fetal viability, not applicable or unspecified: Secondary | ICD-10-CM | POA: Diagnosis not present

## 2020-01-24 DIAGNOSIS — R109 Unspecified abdominal pain: Secondary | ICD-10-CM | POA: Diagnosis not present

## 2020-01-24 DIAGNOSIS — Z3201 Encounter for pregnancy test, result positive: Secondary | ICD-10-CM | POA: Diagnosis not present

## 2020-01-24 DIAGNOSIS — O039 Complete or unspecified spontaneous abortion without complication: Secondary | ICD-10-CM | POA: Diagnosis not present

## 2020-01-24 DIAGNOSIS — Z3A00 Weeks of gestation of pregnancy not specified: Secondary | ICD-10-CM | POA: Insufficient documentation

## 2020-01-24 DIAGNOSIS — O26899 Other specified pregnancy related conditions, unspecified trimester: Secondary | ICD-10-CM | POA: Insufficient documentation

## 2020-01-24 DIAGNOSIS — Z79899 Other long term (current) drug therapy: Secondary | ICD-10-CM | POA: Insufficient documentation

## 2020-01-24 DIAGNOSIS — O209 Hemorrhage in early pregnancy, unspecified: Secondary | ICD-10-CM | POA: Diagnosis not present

## 2020-01-24 LAB — WET PREP, GENITAL
Clue Cells Wet Prep HPF POC: NONE SEEN
Sperm: NONE SEEN
Trich, Wet Prep: NONE SEEN
Yeast Wet Prep HPF POC: NONE SEEN

## 2020-01-24 LAB — URINALYSIS, ROUTINE W REFLEX MICROSCOPIC
Bilirubin Urine: NEGATIVE
Glucose, UA: NEGATIVE mg/dL
Ketones, ur: NEGATIVE mg/dL
Leukocytes,Ua: NEGATIVE
Nitrite: NEGATIVE
Protein, ur: NEGATIVE mg/dL
Specific Gravity, Urine: 1.003 — ABNORMAL LOW (ref 1.005–1.030)
pH: 7 (ref 5.0–8.0)

## 2020-01-24 LAB — CBC
HCT: 39.6 % (ref 36.0–46.0)
Hemoglobin: 13.5 g/dL (ref 12.0–15.0)
MCH: 30.1 pg (ref 26.0–34.0)
MCHC: 34.1 g/dL (ref 30.0–36.0)
MCV: 88.4 fL (ref 80.0–100.0)
Platelets: 250 10*3/uL (ref 150–400)
RBC: 4.48 MIL/uL (ref 3.87–5.11)
RDW: 12.6 % (ref 11.5–15.5)
WBC: 9.7 10*3/uL (ref 4.0–10.5)
nRBC: 0 % (ref 0.0–0.2)

## 2020-01-24 LAB — HCG, QUANTITATIVE, PREGNANCY: hCG, Beta Chain, Quant, S: 27398 m[IU]/mL — ABNORMAL HIGH (ref <5)

## 2020-01-24 LAB — POCT PREGNANCY, URINE: Preg Test, Ur: POSITIVE — AB

## 2020-01-24 NOTE — MAU Provider Note (Signed)
History     CSN: [id number]  Arrival date and time: 01/24/20 1318   First Provider Initiated Contact with Patient 01/24/20 1430      Chief Complaint  Patient presents with  . Vaginal Bleeding  . Abdominal Pain   Debbie Harrison is a 34 y.o. G2P0101 at Unknown GA who receives care at [facility].  She presents today for Vaginal Bleeding and Abdominal Pain.  She states she has not had a period since delivery of her son in 2019. She reports taking a pregnancy test ~ 5 weeks ago after having an aversion to coffee and experiencing increased fatigue. She reports spotting that started around 10pm last night that is now so heavy it is noted in the toilet with urination.  Patient reports passing small clots and has some cramping that was mild initially (1-2/10), but has progressed.  She states the cramping is now a 4-5/10.  Patient states the pain is improved with laying on her side and worsened with laying on her back.  Patient reports that she has not taken anything for the cramping.  Patient also denies recent sexual activity.    OB History    Gravida  2   Para  1   Term      Preterm  1   AB      Living  1     SAB      TAB      Ectopic      Multiple  0   Live Births  1           Past Medical History:  Diagnosis Date  . Anemia   . Vaginal Pap smear, abnormal 2020    Past Surgical History:  Procedure Laterality Date  . breast lift    . LEEP    . wisdom teeth extraxction      Family History  Problem Relation Age of Onset  . Hypertension Father     Social History   Tobacco Use  . Smoking status: Never Smoker  . Smokeless tobacco: Never Used  Vaping Use  . Vaping Use: Never used  Substance Use Topics  . Alcohol use: Not Currently    Alcohol/week: 3.0 standard drinks    Types: 3 Glasses of wine per week  . Drug use: Never    Allergies: No Known Allergies  Medications Prior to Admission  Medication Sig Dispense Refill Last Dose  . Prenatal MV-Min-Fe  Fum-FA-DHA (PRENATAL 1 PO) Take 1 tablet by mouth daily.    01/24/2020 at Unknown time  . metoCLOPramide (REGLAN) 10 MG tablet Take 1 tablet (10 mg total) by mouth 4 (four) times daily. 120 tablet 3   . norethindrone (MICRONOR) 0.35 MG tablet Take 1 tablet (0.35 mg total) by mouth daily. 1 Package 11     Review of Systems  Constitutional: Negative for chills and fever.  Respiratory: Negative for cough and shortness of breath.   Gastrointestinal: Negative for abdominal pain, constipation, diarrhea, nausea and vomiting.  Genitourinary: Positive for pelvic pain (Cramping) and vaginal bleeding. Negative for difficulty urinating and dysuria.  Musculoskeletal: Negative for back pain.  Neurological: Negative for dizziness, light-headedness and headaches.   Physical Exam   Blood pressure 120/76, pulse 86, temperature 98.8 F (37.1 C), temperature source Oral, resp. rate 18, height 5\' 7"  (1.702 m), weight 65 kg, last menstrual period 04/25/2019, SpO2 100 %, currently breastfeeding.  Physical Exam Vitals reviewed. Exam conducted with a chaperone present.  Constitutional:  Appearance: Normal appearance. She is well-developed.  HENT:     Head: Normocephalic and atraumatic.  Eyes:     Conjunctiva/sclera: Conjunctivae normal.  Cardiovascular:     Rate and Rhythm: Normal rate and regular rhythm.     Heart sounds: Normal heart sounds.  Pulmonary:     Effort: Pulmonary effort is normal. No respiratory distress.     Breath sounds: Normal breath sounds.  Abdominal:     General: Abdomen is flat. Bowel sounds are normal.     Palpations: Abdomen is soft.  Genitourinary:    Comments: Speculum Exam: -Normal External Genitalia: Non tender, Small amt dried blood noted on thighs and scant red blood noted at introitus.  -Vaginal Vault: Visualization initially limited due to copious amt of blood in vault.  Able to remove with ring forceps and 4x4 gauze x2.  Large lemon sized clot removed from vault and  membranes teased from os.  -Cervix:Pink, no lesions, cysts, or polyps.  Appears open with active bleeding from os-Ring forcep inserted into external canal with no apparent clots removed. GC/CT collected -Bimanual Exam:  Deferred  Musculoskeletal:        General: Normal range of motion.     Cervical back: Normal range of motion.  Skin:    General: Skin is warm and dry.  Neurological:     Mental Status: She is alert and oriented to person, place, and time.  Psychiatric:        Mood and Affect: Mood normal.        Thought Content: Thought content normal.     MAU Course  Procedures Results for orders placed or performed during the hospital encounter of 01/24/20 (from the past 24 hour(s))  Pregnancy, urine POC     Status: Abnormal   Collection Time: 01/24/20  1:47 PM  Result Value Ref Range   Preg Test, Ur POSITIVE (A) NEGATIVE  Urinalysis, Routine w reflex microscopic Urine, Clean Catch     Status: Abnormal   Collection Time: 01/24/20  1:49 PM  Result Value Ref Range   Color, Urine STRAW (A) YELLOW   APPearance CLEAR CLEAR   Specific Gravity, Urine 1.003 (L) 1.005 - 1.030   pH 7.0 5.0 - 8.0   Glucose, UA NEGATIVE NEGATIVE mg/dL   Hgb urine dipstick LARGE (A) NEGATIVE   Bilirubin Urine NEGATIVE NEGATIVE   Ketones, ur NEGATIVE NEGATIVE mg/dL   Protein, ur NEGATIVE NEGATIVE mg/dL   Nitrite NEGATIVE NEGATIVE   Leukocytes,Ua NEGATIVE NEGATIVE   RBC / HPF 0-5 0 - 5 RBC/hpf   Bacteria, UA RARE (A) NONE SEEN  CBC     Status: None   Collection Time: 01/24/20  2:28 PM  Result Value Ref Range   WBC 9.7 4.0 - 10.5 K/uL   RBC 4.48 3.87 - 5.11 MIL/uL   Hemoglobin 13.5 12.0 - 15.0 g/dL   HCT 73.5 36 - 46 %   MCV 88.4 80.0 - 100.0 fL   MCH 30.1 26.0 - 34.0 pg   MCHC 34.1 30.0 - 36.0 g/dL   RDW 32.9 92.4 - 26.8 %   Platelets 250 150 - 400 K/uL   nRBC 0.0 0.0 - 0.2 %  hCG, quantitative, pregnancy     Status: Abnormal   Collection Time: 01/24/20  2:28 PM  Result Value Ref Range    hCG, Beta Chain, Quant, S 27,398 (H) <5 mIU/mL  Wet prep, genital     Status: Abnormal   Collection Time: 01/24/20  2:50 PM  Result Value Ref Range   Yeast Wet Prep HPF POC NONE SEEN NONE SEEN   Trich, Wet Prep NONE SEEN NONE SEEN   Clue Cells Wet Prep HPF POC NONE SEEN NONE SEEN   WBC, Wet Prep HPF POC FEW (A) NONE SEEN   Sperm NONE SEEN    US OB LESS THAN 14 WEEKS WITH OB TRANSVAGINAL  Result Date: 01/24/2020 CLINICAL DATA:  34 year old with a positive urine pregnancy test who presents with vaginal bleeding and pelvic cramping that began last night. The patient has not had a menstrual period since she delivered her last child on 01/23/2019, meaning that her last period was on 05/12/2018. Quantitative beta hCG is pending. EXAM: OBSTETRIC <14 WK [place] AND TRANSVAGINAL OB US TECHNIQUE: Both transabdominal and transvaginal ultrasound examinations were performed for complete evaluation of the gestation as well as the maternal uterus, adnexal regions, and pelvic cul-de-sac. Transvaginal technique was performed to assess early pregnancy. COMPARISON:  None this gestation. FINDINGS: Intrauterine gestational sac: Not visualized. Yolk sac:  Not visualized. Embryo:  Not visualized. Cardiac Activity: Not visualized. MSD: Not applicable. CRL: Not applicable. [place] EDC: Not applicable. Maternal uterus/adnexae: Endometrial thickening up to approximately 2.0 cm with minimal fluid in the endometrial canal. No myometrial abnormality. Normal appearing LEFT ovary measuring approximately 2.7 x 1.7 x 2.6 cm, containing small follicular cysts and demonstrating normal color Doppler flow. Normal appearing RIGHT ovary measuring approximately 2.7 x 1.6 x 1.8 cm, containing small follicular cysts and demonstrating normal color Doppler flow. No adnexal masses or free pelvic fluid. IMPRESSION: 1. No visible intrauterine gestational sac. Please correlate with quantitative beta hCG when these results become available. 2. Endometrial  thickening up to approximately 2.0 cm. 3. Normal appearing ovaries. 4. No adnexal masses or free pelvic fluid. Electronically Signed   By: Hulan Saas M.D.   On: 01/24/2020 15:20    MDM Pelvic Exam; Wet Prep and GC/CT Labs: UA, UPT, CBC, hCG Ultrasound Assessment and Plan  34 year old G2P0101 at Unknown GA Vaginal Bleeding Cramping  -POC Reviewed. -Exam performed and findings discussed. -Patient tolerated exam extremely well! -Cultures collected and sent.  -Informed that findings highly suspicious for SAB. -Will hold specimen and send to pathology dependent upon US findings. -Labs ordered. -Will send for [place] and await results.   Cherre Robins 01/24/2020, 2:30 PM   Reassessment (4:33 PM) No IUP hCG 41660  -[place] results reviewed. -Provider to bedside to discuss results.  -Patient questions causes of  SAB and informed that can range from various possibilites, but causes usually unknown. -Informed that findings would be sent to pathology to confirm, but based on hCG and [place] results, findings appear likely for SAB. -Discussed follow up in 2 days for repeat quant and change of NOB appt to F/U SAB appt. -Patient agreeable and without further questions or concerns. -Strongly encouraged to return to MAU if symptoms worsen or with the onset of new symptoms. -Discharged to home in stable condition.  Cherre Robins MSN, CNM Advanced Practice Provider, [facility] for [employer]

## 2020-01-24 NOTE — Telephone Encounter (Signed)
Pt called stating she had her son last year and has not had a period. Pt states she took a pregnancy test 5 weeks ago and it was positive. Pt states she is having some light spotting. Pt is scheduled for a NEW OB appt on 02/02/20. Advised pt to go to MAU if she starts bleeding heavy like a period. Understanding was voiced. Phillip Maffei l Asjia Berrios, CMA

## 2020-01-24 NOTE — MAU Note (Signed)
Presents with c/o VB and abdominal cramping that began last night.  Denies recent coitus.  LMP 05/13/2019, no period since last delivery on 01/23/2019.

## 2020-01-24 NOTE — Discharge Instructions (Signed)
Managing Pregnancy Loss Pregnancy loss can happen any time during a pregnancy. Often the cause is not known. It is rarely because of anything you did. Pregnancy loss in early pregnancy (during the first trimester) is called a miscarriage. This type of pregnancy loss is the most common. Pregnancy loss that happens after 20 weeks of pregnancy is called fetal demise if the baby's heart stops beating before birth. Fetal demise is much less common. Some women experience spontaneous labor shortly after fetal demise resulting in a stillborn birth (stillbirth). Any pregnancy loss can be devastating. You will need to recover both physically and emotionally. Most women are able to get pregnant again after a pregnancy loss and deliver a healthy baby. How to manage emotional recovery  Pregnancy loss is very hard emotionally. You may feel many different emotions while you grieve. You may feel sad and angry. You may also feel guilty. It is normal to have periods of crying. Emotional recovery can take longer than physical recovery. It is different for everyone. Taking these steps can help you in managing this loss:  Remember that it is unlikely you did anything to cause the pregnancy loss.  Share your thoughts and feelings with friends, family, and your partner. Remember that your partner is also recovering emotionally.  Make sure you have a good support system. Do not spend too much time alone.  Meet with a pregnancy loss counselor or join a pregnancy loss support group.  Get enough sleep and eat a healthy diet. Return to regular exercise when you have recovered physically.  Do not use drugs or alcohol to manage your emotions.  Consider seeing a mental health professional to help you recover emotionally.  Ask a friend or loved one to help you decide what to do with any clothing and nursery items you received for your baby. In the case of a stillbirth, many women benefit from taking additional steps in the  grieving process. You may want to:  Hold your baby after the birth.  Name your baby.  Request a birth certificate.  Create a keepsake such as handprints or footprints.  Dress your baby and have a picture taken.  Make funeral arrangements.  Ask for a baptism or blessing. Hospitals have staff members who can help you with all these arrangements. How to recognize emotional stress It is normal to have emotional stress after a pregnancy loss. But emotional stress that lasts a long time or becomes severe requires treatment. Watch out for these signs of severe emotional stress:  Sadness, anger, or guilt that is not going away and is interfering with your normal activities.  Relationship problems that have occurred or gotten worse since the pregnancy loss.  Signs of depression that last longer than 2 weeks. These may include: ? Sadness. ? Anxiety. ? Hopelessness. ? Loss of interest in activities you enjoy. ? Inability to concentrate. ? Trouble sleeping or sleeping too much. ? Loss of appetite or overeating. ? Thoughts of death or of hurting yourself. Follow these instructions at home:  Take over-the-counter and prescription medicines only as told by your health care provider.  Rest at home until your energy level returns. Return to your normal activities as told by your health care provider. Ask your health care provider what activities are safe for you.  When you are ready, meet with your health care provider to discuss steps to take for a future pregnancy.  Keep all follow-up visits as told by your health care provider. This is important.   Where to find support  To help you and your partner with the process of grieving, talk with your health care provider or seek counseling.  Consider meeting with others who have experienced pregnancy loss. Ask your health care provider about support groups and resources. Where to find more information  [place].S[place] Department of Health and Human  Services Office on Women's Health: www.womenshealth.gov  American Pregnancy Association: www.americanpregnancy.org Contact a health care provider if:  You continue to experience grief, sadness, or lack of motivation for everyday activities, and those feelings do not improve over time.  You are struggling to recover emotionally, especially if you are using alcohol or substances to help. Get help right away if:  You have thoughts of hurting yourself or others. If you ever feel like you may hurt yourself or others, or have thoughts about taking your own life, get help right away. You can go to your nearest emergency department or call:  Your local emergency services (911 in the [place]  A suicide crisis helpline, such as the National Suicide Prevention Lifeline at 1-800-273-8255. This is open 24 hours a day. Summary  Any pregnancy loss can be difficult physically and emotionally.  You may experience many different emotions while you grieve. Emotional recovery can last longer than physical recovery.  It is normal to have emotional stress after a pregnancy loss. But emotional stress that lasts a long time or becomes severe requires treatment.  See your health care provider if you are struggling emotionally after a pregnancy loss. This information is not intended to replace advice given to you by your health care provider. Make sure you discuss any questions you have with your health care provider. Document Revised: 08/31/2018 Document Reviewed: 07/22/2017 Elsevier Patient Education  2020 Elsevier Inc.  

## 2020-01-25 LAB — GC/CHLAMYDIA PROBE AMP (~~LOC~~) NOT AT ARMC
Chlamydia: NEGATIVE
Comment: NEGATIVE
Comment: NORMAL
Neisseria Gonorrhea: NEGATIVE

## 2020-01-26 ENCOUNTER — Ambulatory Visit: Payer: BC Managed Care – PPO

## 2020-01-30 LAB — SURGICAL PATHOLOGY

## 2020-02-02 ENCOUNTER — Encounter: Payer: Self-pay | Admitting: Family Medicine

## 2020-02-02 ENCOUNTER — Other Ambulatory Visit: Payer: Self-pay

## 2020-02-02 ENCOUNTER — Ambulatory Visit (INDEPENDENT_AMBULATORY_CARE_PROVIDER_SITE_OTHER): Payer: BC Managed Care – PPO | Admitting: Family Medicine

## 2020-02-02 ENCOUNTER — Other Ambulatory Visit (HOSPITAL_COMMUNITY)
Admission: RE | Admit: 2020-02-02 | Discharge: 2020-02-02 | Disposition: A | Discharge status: Home / Self Care | Payer: BC Managed Care – PPO | Source: Ambulatory Visit | Attending: Family Medicine | Admitting: Family Medicine

## 2020-02-02 VITALS — BP 102/75 | HR 63 | Wt 145.0 lb

## 2020-02-02 DIAGNOSIS — N911 Secondary amenorrhea: Secondary | ICD-10-CM

## 2020-02-02 DIAGNOSIS — N898 Other specified noninflammatory disorders of vagina: Secondary | ICD-10-CM | POA: Insufficient documentation

## 2020-02-02 DIAGNOSIS — O039 Complete or unspecified spontaneous abortion without complication: Secondary | ICD-10-CM | POA: Insufficient documentation

## 2020-02-02 NOTE — Progress Notes (Signed)
   Subjective:    Patient ID: Debbie Harrison, female    DOB: July 24, 1985, 34 y.o.   MRN: [id number]  HPI Patient seen for pink discharge and foul odor over the past couple of days. She is 10 days s/p SAB. Stopped bleeding a few days ago.  Additionally, her last period was in 04/2018, which was with her first pregnancy. She delivered on 12/2018. She did breastfeed, but not exclusively and didn't produce a lot of breastmilk, but she stopped breastfeeding in March 2021. She never had a period since her pregnancy or since stopping breastfeeding.    Review of Systems     Objective:   Physical Exam Vitals reviewed. Exam conducted with a chaperone present.  Constitutional:      Appearance: Normal appearance.  Pulmonary:     Effort: Pulmonary effort is normal.  Abdominal:     Hernia: There is no hernia in the left inguinal area or right inguinal area.  Genitourinary:    Exam position: Supine.     Labia:        Right: No rash, tenderness, lesion or injury.        Left: No rash, tenderness, lesion or injury.   Lymphadenopathy:     Lower Body: No right inguinal adenopathy. No left inguinal adenopathy.  Skin:    Capillary Refill: Capillary refill takes less than 2 seconds.  Neurological:     Mental Status: She is alert.  Psychiatric:        Mood and Affect: Mood normal.        Thought Content: Thought content normal.        Judgment: Judgment normal.       Assessment & Plan:  1. SAB (spontaneous abortion) Check HCG - Beta hCG quant (ref lab)  2. Secondary amenorrhea Uncertain of etiology - possibly elevated prolactin? - Beta hCG quant (ref lab) - Prolactin - TSH - Follicle stimulating hormone  3. Vaginal discharge - Cervicovaginal ancillary only( [place])

## 2020-02-03 LAB — TSH: TSH: 2.19 u[IU]/mL (ref 0.450–4.500)

## 2020-02-03 LAB — BETA HCG QUANT (REF LAB): hCG Quant: 158 m[IU]/mL

## 2020-02-03 LAB — PROLACTIN: Prolactin: 4.4 ng/mL — ABNORMAL LOW (ref 4.8–23.3)

## 2020-02-03 LAB — FOLLICLE STIMULATING HORMONE: FSH: 4.5 m[IU]/mL

## 2020-02-05 LAB — CERVICOVAGINAL ANCILLARY ONLY
Bacterial Vaginitis (gardnerella): POSITIVE — AB
Candida Glabrata: NEGATIVE
Candida Vaginitis: NEGATIVE
Chlamydia: NEGATIVE
Comment: NEGATIVE
Comment: NEGATIVE
Comment: NEGATIVE
Comment: NEGATIVE
Comment: NEGATIVE
Comment: NORMAL
Neisseria Gonorrhea: NEGATIVE
Trichomonas: NEGATIVE

## 2020-02-06 ENCOUNTER — Telehealth: Payer: Self-pay

## 2020-02-06 DIAGNOSIS — B9689 Other specified bacterial agents as the cause of diseases classified elsewhere: Secondary | ICD-10-CM

## 2020-02-06 MED ORDER — METRONIDAZOLE 500 MG PO TABS: 500.0000 mg | ORAL_TABLET | Freq: Two times a day (BID) | ORAL | 0 refills | Status: DC

## 2020-02-06 NOTE — Telephone Encounter (Signed)
-----   Message from Levie Heritage, DO sent at 02/05/2020 12:40 PM EDT ----- Needs repeat quant in 1 week

## 2020-02-06 NOTE — Telephone Encounter (Signed)
Called pt regarding Beta HCG results. Pt made aware that her Debbie Harrison is 158 and will need to be repeated in 1 week. Pt also aware that she has BV. Flagyl was sent to pt's pharmacy.  Understanding was voiced. Debbie Harrison l Layla Gramm, CMA

## 2020-02-12 ENCOUNTER — Ambulatory Visit: Payer: BC Managed Care – PPO

## 2020-02-12 ENCOUNTER — Other Ambulatory Visit: Payer: Self-pay

## 2020-02-12 DIAGNOSIS — O039 Complete or unspecified spontaneous abortion without complication: Secondary | ICD-10-CM | POA: Diagnosis not present

## 2020-02-12 DIAGNOSIS — N911 Secondary amenorrhea: Secondary | ICD-10-CM

## 2020-02-12 NOTE — Progress Notes (Signed)
Patient sent to lab for blood draw. Atul Delucia RN 

## 2020-02-13 LAB — BETA HCG QUANT (REF LAB): hCG Quant: 18 m[IU]/mL

## 2020-02-13 LAB — PROLACTIN: Prolactin: 5.6 ng/mL (ref 4.8–23.3)

## 2020-02-13 LAB — CORTISOL: Cortisol: 10.7 ug/dL

## 2020-02-13 LAB — T4, FREE: Free T4: 1.19 ng/dL (ref 0.82–1.77)

## 2020-05-25 NOTE — L&D Delivery Note (Addendum)
Delivery Note AROM w/clear fluid.  2 contractions later, at 5:59 AM a viable female was delivered via Vaginal, Spontaneous (Presentation: LOA     ).  APGAR: 9, 9; weight pending.  After 1 minute, the cord was clamped and cut. 40 units of pitocin diluted in 1000cc LR was infused rapidly IV.  The placenta separated spontaneously and delivered via CCT and maternal pushing effort.  It was inspected and appears to be intact with a 3 VC.   Anesthesia: None Episiotomy: None Lacerations:  none Suture Repair:  Est. Blood Loss (mL):  158  Mom to postpartum.  Baby to Couplet care / Skin to Skin.  Debbie Harrison 03/07/2021, 6:25 AM

## 2020-07-05 DIAGNOSIS — R350 Frequency of micturition: Secondary | ICD-10-CM | POA: Diagnosis not present

## 2020-07-05 DIAGNOSIS — Z331 Pregnant state, incidental: Secondary | ICD-10-CM | POA: Diagnosis not present

## 2020-07-05 DIAGNOSIS — R1030 Lower abdominal pain, unspecified: Secondary | ICD-10-CM | POA: Diagnosis not present

## 2020-07-05 DIAGNOSIS — R300 Dysuria: Secondary | ICD-10-CM | POA: Diagnosis not present

## 2020-08-06 ENCOUNTER — Other Ambulatory Visit: Payer: Self-pay

## 2020-08-06 ENCOUNTER — Encounter: Payer: Self-pay | Admitting: Advanced Practice Midwife

## 2020-08-06 ENCOUNTER — Other Ambulatory Visit (HOSPITAL_COMMUNITY)
Admission: RE | Admit: 2020-08-06 | Discharge: 2020-08-06 | Disposition: A | Discharge status: Home / Self Care | Payer: BC Managed Care – PPO | Source: Ambulatory Visit | Attending: Advanced Practice Midwife | Admitting: Advanced Practice Midwife

## 2020-08-06 ENCOUNTER — Ambulatory Visit (INDEPENDENT_AMBULATORY_CARE_PROVIDER_SITE_OTHER): Payer: BC Managed Care – PPO | Admitting: Advanced Practice Midwife

## 2020-08-06 VITALS — BP 121/77 | HR 75 | Wt 140.0 lb

## 2020-08-06 DIAGNOSIS — Z3480 Encounter for supervision of other normal pregnancy, unspecified trimester: Secondary | ICD-10-CM | POA: Diagnosis not present

## 2020-08-06 DIAGNOSIS — O09299 Supervision of pregnancy with other poor reproductive or obstetric history, unspecified trimester: Secondary | ICD-10-CM | POA: Insufficient documentation

## 2020-08-06 DIAGNOSIS — Z3403 Encounter for supervision of normal first pregnancy, third trimester: Secondary | ICD-10-CM | POA: Insufficient documentation

## 2020-08-06 DIAGNOSIS — Z9889 Other specified postprocedural states: Secondary | ICD-10-CM | POA: Insufficient documentation

## 2020-08-06 DIAGNOSIS — O3440 Maternal care for other abnormalities of cervix, unspecified trimester: Secondary | ICD-10-CM

## 2020-08-06 DIAGNOSIS — O09291 Supervision of pregnancy with other poor reproductive or obstetric history, first trimester: Secondary | ICD-10-CM

## 2020-08-06 MED ORDER — FOLIC ACID 1 MG PO TABS: 4.0000 mg | ORAL_TABLET | Freq: Every day | ORAL | 2 refills | Status: AC

## 2020-08-06 NOTE — Progress Notes (Signed)
INITIAL OBSTETRICAL VISIT Patient name: Debbie Harrison MRN [id number]  Date of birth: 1985/07/19 Chief Complaint:   Initial Prenatal Visit  History of Present Illness:   Debbie Harrison is a 35 y.o. [id number] Caucasian female at [redacted]w[redacted]d by [place] at 9 weeks with an Estimated Date of Delivery: 03/07/21 being seen today for her initial obstetrical visit.   Her obstetrical history is significant for History Leep/CKC, friable cervix vs polyp, Prev child with spina bifida occulta.   Today she reports no complaints.  No flowsheet data found.  Patient's last menstrual period was 05/31/2020. Last pap 2020 . Results were: normal Review of Systems:   Pertinent items are noted in HPI Denies cramping/contractions, leakage of fluid, vaginal bleeding, abnormal vaginal discharge w/ itching/odor/irritation, headaches, visual changes, shortness of breath, chest pain, abdominal pain, severe nausea/vomiting, or problems with urination or bowel movements unless otherwise stated above.  Pertinent History Reviewed:  Reviewed past medical,surgical, social, obstetrical and family history.  Reviewed problem list, medications and allergies. OB History  Gravida Para Term Preterm AB Living  3 1   1 1 1   SAB IAB Ectopic Multiple Live Births  1     0 1    # Outcome Date GA Lbr Len/2nd Weight Sex Delivery Anes PTL Lv  3 Current           2 SAB 01/24/20          1 Preterm 01/23/19 [redacted]w[redacted]d 01:12 / 00:43 5 lb 10.5 oz (2.566 kg) M Vag-Spont EPI  LIV   Physical Assessment:   Vitals:   08/06/20 0946  BP: 121/77  Pulse: 75  Weight: 140 lb (63.5 kg)  Body mass index is 21.93 kg/m.       Physical Examination:  General appearance - well appearing, and in no distress  Mental status - alert, oriented to person, place, and time  Psych:  She has a normal mood and affect  Skin - warm and dry, normal color, no suspicious lesions noted  Chest - effort normal, all lung fields clear to auscultation bilaterally  Heart - normal rate  and regular rhythm  Abdomen - soft, nontender  Extremities:  No swelling or varicosities noted  Pelvic - VULVA: normal appearing vulva with no masses, tenderness or lesions  VAGINA: normal appearing vagina with normal color and discharge, no lesions  CERVIX: Somewhat shortened, firm                             Right half of cervix beefy red.  Suspect polyp vs ectropion, advised will have bleeding today post pap, if continues, may need to cauterize   Thin prep pap is done  Chaperone: Howard RN     No results found for this or any previous visit (from the past 24 hour(s)).  Assessment & Plan:  1) Low-Risk Pregnancy 08/08/20 at [redacted]w[redacted]d with an Estimated Date of Delivery: 03/07/21      (?High risk due to hx cervical cold knife cone, prev child with Spina bifida) 2) Initial OB visit  3)  Hx CKC/LEEP       Half of cervix with polyp vs ectropion       Will get cervical length  4)   Prev child has spina bifida occulta       Advised start Folate 4mg  daily x 12 wks       Will do Panorama and AFP (was normal last child)  Meds:  Meds ordered this encounter  Medications  . folic acid (FOLVITE) 1 MG tablet    Sig: Take 4 tablets (4 mg total) by mouth daily.    Dispense:  120 tablet    Refill:  2    Order Specific Question:   Supervising Provider    Answer:   Jaynie Collins A [[id number]]    Initial labs obtained Continue prenatal vitamins Reviewed n/v relief measures and warning s/s to report Reviewed recommended weight gain based on pre-gravid BMI Encouraged well-balanced diet Genetic & carrier screening discussed: requests Panorama and AFP, requests Panorama and AFP Ultrasound discussed; fetal survey: requested CCNC completed> form faxed if has or is planning to apply for medicaid The nature of [facility] [employer] with multiple MDs and other Advanced Practice Providers was explained to patient; also emphasized that fellows, residents, and students are part of our  team.   Indications for ASA therapy (per uptodate) One of the following: H/O preeclampsia, especially early onset/adverse outcome No Multifetal gestation No CHTN No T1DM or T2DM No Chronic kidney disease No Autoimmune disease (antiphospholipid syndrome, systemic lupus erythematosus) No  OR Two or more of the following: Nulliparity No Obesity (BMI>30 kg/m2) No Family h/o preeclampsia in mother or sister No Age ?35 years No Sociodemographic characteristics (African American race, low socioeconomic level) No Personal risk factors (eg, previous pregnancy w/ LBW or SGA, previous adverse pregnancy outcome [eg, stillbirth], interval >10 years between pregnancies) No  Indications for early A1C (per uptodate) BMI >=25 (>=23 in Asian women) AND one of the following GDM in a previous pregnancy No Previous A1C?5.7, impaired glucose tolerance, or impaired fasting glucose on previous testing No First-degree relative with diabetes No High-risk race/ethnicity (eg, African American, Latino, Native American, Asian American, Pacific Islander) No History of cardiovascular disease No HTN or on therapy for hypertension No HDL cholesterol level <35 mg/dL (2.75 mmol/L) and/or a triglyceride level >250 mg/dL (1.70 mmol/L) No PCOSYes Physical inactivity No Other clinical condition associated with insulin resistance (eg, severe obesity, acanthosis nigricans) No Previous birth of an infant weighing ?4000 g No Previous stillbirth of unknown cause No >= 40yo No  Follow-up: Return in about 4 weeks (around 09/03/2020) for [employer].   Orders Placed This Encounter  Procedures  . Culture, OB Urine  . US OB Transvaginal  . CBC/D/Plt+RPR+Rh+ABO+Rub Ab...    Wynelle Bourgeois CNM, [facility] 08/06/2020 12:45 PM

## 2020-08-06 NOTE — Patient Instructions (Signed)
Obstetrics: Normal and Problem Pregnancies (7th ed., pp. 102-121). [place], [place]: Elsevier."> Textbook of Family Medicine (9th ed., pp. 365-410). [place], [place]: Elsevier [employer].">  First Trimester of Pregnancy  The first trimester of pregnancy starts on the first day of your last menstrual period until the end of week 12. This is months 1 through 3 of pregnancy. A week after a sperm fertilizes an egg, the egg will implant into the wall of the uterus and begin to develop into a baby. By the end of 12 weeks, all the baby's organs will be formed and the baby will be 2-3 inches in size. Body changes during your first trimester Your body goes through many changes during pregnancy. The changes vary and generally return to normal after your baby is born. Physical changes  You may gain or lose weight.  Your breasts may begin to grow larger and become tender. The tissue that surrounds your nipples (areola) may become darker.  Dark spots or blotches (chloasma or mask of pregnancy) may develop on your face.  You may have changes in your hair. These can include thickening or thinning of your hair or changes in texture. Health changes  You may feel nauseous, and you may vomit.  You may have heartburn.  You may develop headaches.  You may develop constipation.  Your gums may bleed and may be sensitive to brushing and flossing. Other changes  You may tire easily.  You may urinate more often.  Your menstrual periods will stop.  You may have a loss of appetite.  You may develop cravings for certain kinds of food.  You may have changes in your emotions from day to day.  You may have more vivid and strange dreams. Follow these instructions at home: Medicines  Follow your health care provider's instructions regarding medicine use. Specific medicines may be either safe or unsafe to take during pregnancy. Do not take any medicines unless told to by your health care provider.  Take a  prenatal vitamin that contains at least 600 micrograms (mcg) of folic acid. Eating and drinking  Eat a healthy diet that includes fresh fruits and vegetables, whole grains, good sources of protein such as meat, eggs, or tofu, and low-fat dairy products.  Avoid raw meat and unpasteurized juice, milk, and cheese. These carry germs that can harm you and your baby.  If you feel nauseous or you vomit: ? Eat 4 or 5 small meals a day instead of 3 large meals. ? Try eating a few soda crackers. ? Drink liquids between meals instead of during meals.  You may need to take these actions to prevent or treat constipation: ? Drink enough fluid to keep your urine pale yellow. ? Eat foods that are high in fiber, such as beans, whole grains, and fresh fruits and vegetables. ? Limit foods that are high in fat and processed sugars, such as fried or sweet foods. Activity  Exercise only as directed by your health care provider. Most people can continue their usual exercise routine during pregnancy. Try to exercise for 30 minutes at least 5 days a week.  Stop exercising if you develop pain or cramping in the lower abdomen or lower back.  Avoid exercising if it is very hot or humid or if you are at high altitude.  Avoid heavy lifting.  If you choose to, you may have sex unless your health care provider tells you not to. Relieving pain and discomfort  Wear a good support bra to relieve breast   tenderness.  Rest with your legs elevated if you have leg cramps or low back pain.  If you develop bulging veins (varicose veins) in your legs: ? Wear support hose as told by your health care provider. ? Elevate your feet for 15 minutes, 3-4 times a day. ? Limit salt in your diet. Safety  Wear your seat belt at all times when driving or riding in a car.  Talk with your health care provider if someone is verbally or physically abusive to you.  Talk with your health care provider if you are feeling sad or have  thoughts of hurting yourself. Lifestyle  Do not use hot tubs, steam rooms, or saunas.  Do not douche. Do not use tampons or scented sanitary pads.  Do not use herbal remedies, alcohol, illegal drugs, or medicines that are not approved by your health care provider. Chemicals in these products can harm your baby.  Do not use any products that contain nicotine or tobacco, such as cigarettes, e-cigarettes, and chewing tobacco. If you need help quitting, ask your health care provider.  Avoid cat litter boxes and soil used by cats. These carry germs that can cause birth defects in the baby and possibly loss of the unborn baby (fetus) by miscarriage or stillbirth. General instructions  During routine prenatal visits in the first trimester, your health care provider will do a physical exam, perform necessary tests, and ask you how things are going. Keep all follow-up visits. This is important.  Ask for help if you have counseling or nutritional needs during pregnancy. Your health care provider can offer advice or refer you to specialists for help with various needs.  Schedule a dentist appointment. At home, brush your teeth with a soft toothbrush. Floss gently.  Write down your questions. Take them to your prenatal visits. Where to find more information  American Pregnancy Association: americanpregnancy.org  American College of Obstetricians and Gynecologists: acog.org/en/Womens%20Health/Pregnancy  Office on Women's Health: womenshealth.gov/pregnancy Contact a health care provider if you have:  Dizziness.  A fever.  Mild pelvic cramps, pelvic pressure, or nagging pain in the abdominal area.  Nausea, vomiting, or diarrhea that lasts for 24 hours or longer.  A bad-smelling vaginal discharge.  Pain when you urinate.  Known exposure to a contagious illness, such as chickenpox, measles, Zika virus, HIV, or hepatitis. Get help right away if you have:  Spotting or bleeding from your  vagina.  Severe abdominal cramping or pain.  Shortness of breath or chest pain.  Any kind of trauma, such as from a fall or a car crash.  New or increased pain, swelling, or redness in an arm or leg. Summary  The first trimester of pregnancy starts on the first day of your last menstrual period until the end of week 12 (months 1 through 3).  Eating 4 or 5 small meals a day rather than 3 large meals may help to relieve nausea and vomiting.  Do not use any products that contain nicotine or tobacco, such as cigarettes, e-cigarettes, and chewing tobacco. If you need help quitting, ask your health care provider.  Keep all follow-up visits. This is important. This information is not intended to replace advice given to you by your health care provider. Make sure you discuss any questions you have with your health care provider. Document Revised: 10/18/2019 Document Reviewed: 08/24/2019 Elsevier Patient Education  2021 Elsevier Inc.  

## 2020-08-07 LAB — CBC/D/PLT+RPR+RH+ABO+RUB AB...
Antibody Screen: NEGATIVE
Basophils Absolute: 0 10*3/uL (ref 0.0–0.2)
Basos: 0 %
EOS (ABSOLUTE): 0.1 10*3/uL (ref 0.0–0.4)
Eos: 1 %
HCV Ab: <0.1 s/co ratio (ref 0.0–0.9)
HIV Screen 4th Generation wRfx: NONREACTIVE
Hematocrit: 40.2 % (ref 34.0–46.6)
Hemoglobin: 13.5 g/dL (ref 11.1–15.9)
Hepatitis B Surface Ag: NEGATIVE
Immature Grans (Abs): 0 10*3/uL (ref 0.0–0.1)
Immature Granulocytes: 0 %
Lymphocytes Absolute: 1.7 10*3/uL (ref 0.7–3.1)
Lymphs: 19 %
MCH: 30.2 pg (ref 26.6–33.0)
MCHC: 33.6 g/dL (ref 31.5–35.7)
MCV: 90 fL (ref 79–97)
Monocytes Absolute: 0.5 10*3/uL (ref 0.1–0.9)
Monocytes: 6 %
Neutrophils Absolute: 6.3 10*3/uL (ref 1.4–7.0)
Neutrophils: 74 %
Platelets: 258 10*3/uL (ref 150–450)
RBC: 4.47 x10E6/uL (ref 3.77–5.28)
RDW: 12.2 % (ref 11.7–15.4)
RPR Ser Ql: NONREACTIVE
Rh Factor: POSITIVE
Rubella Antibodies, IGG: 1.41 index (ref >0.99)
WBC: 8.7 10*3/uL (ref 3.4–10.8)

## 2020-08-07 LAB — HCV INTERPRETATION

## 2020-08-08 LAB — CYTOLOGY - PAP
Chlamydia: NEGATIVE
Comment: NEGATIVE
Comment: NEGATIVE
Comment: NORMAL
Diagnosis: NEGATIVE
High risk HPV: NEGATIVE
Neisseria Gonorrhea: NEGATIVE

## 2020-08-08 LAB — CULTURE, OB URINE

## 2020-08-08 LAB — URINE CULTURE, OB REFLEX

## 2020-08-12 ENCOUNTER — Ambulatory Visit: Payer: BC Managed Care – PPO

## 2020-08-20 ENCOUNTER — Other Ambulatory Visit: Payer: Self-pay

## 2020-08-20 ENCOUNTER — Ambulatory Visit
Admission: RE | Admit: 2020-08-20 | Discharge: 2020-08-20 | Disposition: A | Discharge status: Home / Self Care | Payer: BC Managed Care – PPO | Source: Ambulatory Visit | Attending: Advanced Practice Midwife | Admitting: Advanced Practice Midwife

## 2020-08-20 ENCOUNTER — Ambulatory Visit (INDEPENDENT_AMBULATORY_CARE_PROVIDER_SITE_OTHER): Payer: BC Managed Care – PPO | Admitting: Obstetrics and Gynecology

## 2020-08-20 ENCOUNTER — Other Ambulatory Visit (INDEPENDENT_AMBULATORY_CARE_PROVIDER_SITE_OTHER): Payer: BC Managed Care – PPO

## 2020-08-20 ENCOUNTER — Encounter: Payer: Self-pay | Admitting: Obstetrics and Gynecology

## 2020-08-20 DIAGNOSIS — Z9889 Other specified postprocedural states: Secondary | ICD-10-CM | POA: Diagnosis not present

## 2020-08-20 DIAGNOSIS — Z3A11 11 weeks gestation of pregnancy: Secondary | ICD-10-CM

## 2020-08-20 DIAGNOSIS — Z3A10 10 weeks gestation of pregnancy: Secondary | ICD-10-CM | POA: Diagnosis not present

## 2020-08-20 DIAGNOSIS — N841 Polyp of cervix uteri: Secondary | ICD-10-CM | POA: Diagnosis not present

## 2020-08-20 DIAGNOSIS — Z31430 Encounter of female for testing for genetic disease carrier status for procreative management: Secondary | ICD-10-CM | POA: Diagnosis not present

## 2020-08-20 DIAGNOSIS — N840 Polyp of corpus uteri: Secondary | ICD-10-CM | POA: Diagnosis not present

## 2020-08-20 DIAGNOSIS — Z3480 Encounter for supervision of other normal pregnancy, unspecified trimester: Secondary | ICD-10-CM | POA: Insufficient documentation

## 2020-08-20 DIAGNOSIS — O208 Other hemorrhage in early pregnancy: Secondary | ICD-10-CM | POA: Diagnosis not present

## 2020-08-20 DIAGNOSIS — Z3481 Encounter for supervision of other normal pregnancy, first trimester: Secondary | ICD-10-CM | POA: Diagnosis not present

## 2020-08-20 NOTE — Progress Notes (Addendum)
Pt presents for Panorama labs. Panorama labs were drawn and sent to [facility]. Case Vassell l Manali Mcelmurry, CMA   Patient was assessed and managed by nursing staff during this encounter. I have reviewed the chart and agree with the documentation and plan. I have also made any necessary editorial changes.  Wynelle Bourgeois, CNM 08/20/2020 8:11 PM

## 2020-08-20 NOTE — Progress Notes (Signed)
Patient brought down to office after having [place] for cervical mass.   Patient is g3P011 at [redacted]w[redacted]d. She was referred for [place] due to suspected cervical polyp. Patient has a history of a LEEP procedure, followed by SVD at [redacted]w[redacted]d in 2020. She had a normal pap with neg HPV on 08/06/20. [place] today shows a 1.1cm polypoid lesion with blood flow at external os of cervix, likely to be polyp. She denies any symptoms, no bleeding or discomfort.   Discussed with patient and answered all questions. She will need cervical length checked at her anatomy scan.    Casper Harrison, MD [facility] Fellow, [facility] for [facility], [facility]

## 2020-08-26 ENCOUNTER — Other Ambulatory Visit: Payer: Self-pay

## 2020-08-26 ENCOUNTER — Telehealth: Payer: Self-pay

## 2020-08-26 DIAGNOSIS — Z3A12 12 weeks gestation of pregnancy: Secondary | ICD-10-CM

## 2020-08-26 DIAGNOSIS — O3440 Maternal care for other abnormalities of cervix, unspecified trimester: Secondary | ICD-10-CM

## 2020-08-26 NOTE — Telephone Encounter (Signed)
Baby scripts called stating pt had elevated BP of 140/73. Called and pt stated that she did not have the bp cuff on right, states she rechecked and BP was 116/85. Pt also made aware that she has a Transvaginal US to check cervical length scheduled on 09/20/20 at 2:45 pm. Understanding was voiced. Milburn Freeney l Shavon Zenz, CMA

## 2020-08-30 NOTE — Progress Notes (Signed)
Left message for patient to return call to office for Panorama results. Armandina Stammer RN

## 2020-09-04 ENCOUNTER — Other Ambulatory Visit: Payer: Self-pay

## 2020-09-04 ENCOUNTER — Ambulatory Visit (INDEPENDENT_AMBULATORY_CARE_PROVIDER_SITE_OTHER): Payer: BC Managed Care – PPO | Admitting: Family Medicine

## 2020-09-04 VITALS — BP 117/70 | HR 77 | Wt 142.0 lb

## 2020-09-04 DIAGNOSIS — Z3A13 13 weeks gestation of pregnancy: Secondary | ICD-10-CM

## 2020-09-04 DIAGNOSIS — Z8759 Personal history of other complications of pregnancy, childbirth and the puerperium: Secondary | ICD-10-CM

## 2020-09-04 DIAGNOSIS — O09292 Supervision of pregnancy with other poor reproductive or obstetric history, second trimester: Secondary | ICD-10-CM

## 2020-09-04 DIAGNOSIS — O3440 Maternal care for other abnormalities of cervix, unspecified trimester: Secondary | ICD-10-CM

## 2020-09-04 DIAGNOSIS — Z3480 Encounter for supervision of other normal pregnancy, unspecified trimester: Secondary | ICD-10-CM

## 2020-09-04 NOTE — Progress Notes (Signed)
   PRENATAL VISIT NOTE  Subjective:  Debbie Harrison is a 35 y.o. [phone] at [redacted]w[redacted]d being seen today for ongoing prenatal care.  She is currently monitored for the following issues for this high-risk pregnancy and has Cervical abnormality affecting pregnancy, antepartum; Supervision of other normal pregnancy, antepartum; Acne; Attention deficit disorder; History of PCOS; Hyperhidrosis; Spina bifida in child of prior pregnancy, currently pregnant; and History of preterm premature rupture of membranes (PPROM) on their problem list.  Patient reports no complaints.  Contractions: Not present. Vag. Bleeding: None.  Movement: Absent. Denies leaking of fluid.   The following portions of the patient's history were reviewed and updated as appropriate: allergies, current medications, past family history, past medical history, past social history, past surgical history and problem list.   Objective:   Vitals:   09/04/20 0903  BP: 117/70  Pulse: 77  Weight: 142 lb (64.4 kg)    Fetal Status: Fetal Heart Rate (bpm): 155   Movement: Absent     General:  Alert, oriented and cooperative. Patient is in no acute distress.  Skin: Skin is warm and dry. No rash noted.   Cardiovascular: Normal heart rate noted  Respiratory: Normal respiratory effort, no problems with respiration noted  Abdomen: Soft, gravid, appropriate for gestational age.  Pain/Pressure: Absent     Pelvic: Cervical exam deferred        Extremities: Normal range of motion.  Edema: None  Mental Status: Normal mood and affect. Normal behavior. Normal judgment and thought content.   Assessment and Plan:  Pregnancy: G3P0111 at [redacted]w[redacted]d 1. [redacted] weeks gestation of pregnancy  2. Supervision of other normal pregnancy, antepartum FHT and FH normal  3. Spina bifida in child of prior pregnancy, currently pregnant in second trimester Folic Acid 4mg  daily AFP at 16-23 weeks Will schedule detail  4. Cervical abnormality affecting pregnancy,  antepartum H/o LEEP - CL at [place].  5. History of preterm premature rupture of membranes (PPROM) Discussed Makena risk/benefits, recent studies showing that it may be less helpful, but harm to baby minimal - patient will think about this.  Preterm labor symptoms and general obstetric precautions including but not limited to vaginal bleeding, contractions, leaking of fluid and fetal movement were reviewed in detail with the patient. Please refer to After Visit Summary for other counseling recommendations.   No follow-ups on file.  Future Appointments  Date Time Provider Department Center  09/20/2020  2:30 PM [facility] NURSE [facility]  09/20/2020  2:45 PM [facility]5 [facility]    [facility] DO

## 2020-09-19 ENCOUNTER — Ambulatory Visit: Payer: BC Managed Care – PPO

## 2020-09-20 ENCOUNTER — Ambulatory Visit: Payer: BC Managed Care – PPO

## 2020-09-20 ENCOUNTER — Other Ambulatory Visit: Payer: BC Managed Care – PPO

## 2020-10-02 ENCOUNTER — Ambulatory Visit (INDEPENDENT_AMBULATORY_CARE_PROVIDER_SITE_OTHER): Payer: BC Managed Care – PPO | Admitting: Family Medicine

## 2020-10-02 ENCOUNTER — Other Ambulatory Visit: Payer: Self-pay

## 2020-10-02 VITALS — BP 103/68 | HR 91 | Wt 144.0 lb

## 2020-10-02 DIAGNOSIS — Z8759 Personal history of other complications of pregnancy, childbirth and the puerperium: Secondary | ICD-10-CM

## 2020-10-02 DIAGNOSIS — Z3480 Encounter for supervision of other normal pregnancy, unspecified trimester: Secondary | ICD-10-CM

## 2020-10-02 DIAGNOSIS — O09292 Supervision of pregnancy with other poor reproductive or obstetric history, second trimester: Secondary | ICD-10-CM

## 2020-10-02 DIAGNOSIS — Z9889 Other specified postprocedural states: Secondary | ICD-10-CM

## 2020-10-02 DIAGNOSIS — Z3A17 17 weeks gestation of pregnancy: Secondary | ICD-10-CM | POA: Diagnosis not present

## 2020-10-02 NOTE — Progress Notes (Signed)
   PRENATAL VISIT NOTE  Subjective:  Debbie Harrison is a 36 y.o. [phone] at [redacted]w[redacted]d being seen today for ongoing prenatal care.  She is currently monitored for the following issues for this high-risk pregnancy and has Cervical abnormality affecting pregnancy, antepartum; Supervision of other normal pregnancy, antepartum; Acne; Attention deficit disorder; History of PCOS; Hyperhidrosis; Spina bifida in child of prior pregnancy, currently pregnant; and History of preterm premature rupture of membranes (PPROM) on their problem list.  Patient reports no complaints.  Contractions: Not present. Vag. Bleeding: None.  Movement: Absent. Denies leaking of fluid.   The following portions of the patient's history were reviewed and updated as appropriate: allergies, current medications, past family history, past medical history, past social history, past surgical history and problem list.   Objective:   Vitals:   10/02/20 0907  BP: 103/68  Pulse: 91  Weight: 144 lb (65.3 kg)    Fetal Status: Fetal Heart Rate (bpm): 154   Movement: Absent     General:  Alert, oriented and cooperative. Patient is in no acute distress.  Skin: Skin is warm and dry. No rash noted.   Cardiovascular: Normal heart rate noted  Respiratory: Normal respiratory effort, no problems with respiration noted  Abdomen: Soft, gravid, appropriate for gestational age.  Pain/Pressure: Absent     Pelvic: Cervical exam deferred        Extremities: Normal range of motion.  Edema: None  Mental Status: Normal mood and affect. Normal behavior. Normal judgment and thought content.   Assessment and Plan:  Pregnancy: G3P0111 at [redacted]w[redacted]d 1. [redacted] weeks gestation of pregnancy - AFP, Serum, Open Spina Bifida  2. Supervision of other normal pregnancy, antepartum FHT and FH normal  3. Spina bifida in child of prior pregnancy, currently pregnant in second trimester On Folic acid 4mg  daily AFP today.   4. H/O LEEP CL at  5. History of preterm  premature rupture of membranes (PPROM)    Preterm labor symptoms and general obstetric precautions including but not limited to vaginal bleeding, contractions, leaking of fluid and fetal movement were reviewed in detail with the patient. Please refer to After Visit Summary for other counseling recommendations.   No follow-ups on file.  Future Appointments  Date Time Provider Department Center  10/14/2020 10:00 AM [facility]  10/14/2020 10:15 AM [facility]    10/16/2020, DO

## 2020-10-04 LAB — AFP, SERUM, OPEN SPINA BIFIDA
AFP MoM: 0.59
AFP Value: 25.9 ng/mL
Gest. Age on Collection Date: 17.5 weeks
Maternal Age At EDD: 33.3 yr
OSBR Risk 1 IN: 10000
Test Results:: NEGATIVE
Weight: 144 [lb_av]

## 2020-10-14 ENCOUNTER — Ambulatory Visit: Payer: BC Managed Care – PPO | Admitting: *Deleted

## 2020-10-14 ENCOUNTER — Other Ambulatory Visit: Payer: Self-pay | Admitting: Family Medicine

## 2020-10-14 ENCOUNTER — Encounter: Payer: Self-pay | Admitting: *Deleted

## 2020-10-14 ENCOUNTER — Other Ambulatory Visit: Payer: Self-pay

## 2020-10-14 ENCOUNTER — Other Ambulatory Visit: Payer: Self-pay | Admitting: Obstetrics

## 2020-10-14 ENCOUNTER — Ambulatory Visit: Payer: BC Managed Care – PPO | Attending: Family Medicine

## 2020-10-14 DIAGNOSIS — Z8759 Personal history of other complications of pregnancy, childbirth and the puerperium: Secondary | ICD-10-CM

## 2020-10-14 DIAGNOSIS — N841 Polyp of cervix uteri: Secondary | ICD-10-CM

## 2020-10-14 DIAGNOSIS — O3440 Maternal care for other abnormalities of cervix, unspecified trimester: Secondary | ICD-10-CM | POA: Insufficient documentation

## 2020-10-14 DIAGNOSIS — O09292 Supervision of pregnancy with other poor reproductive or obstetric history, second trimester: Secondary | ICD-10-CM | POA: Insufficient documentation

## 2020-10-14 DIAGNOSIS — O09213 Supervision of pregnancy with history of pre-term labor, third trimester: Secondary | ICD-10-CM

## 2020-10-14 DIAGNOSIS — Z3480 Encounter for supervision of other normal pregnancy, unspecified trimester: Secondary | ICD-10-CM

## 2020-10-14 DIAGNOSIS — O352XX0 Maternal care for (suspected) hereditary disease in fetus, not applicable or unspecified: Secondary | ICD-10-CM

## 2020-10-14 DIAGNOSIS — Z9889 Other specified postprocedural states: Secondary | ICD-10-CM

## 2020-10-14 DIAGNOSIS — O26849 Uterine size-date discrepancy, unspecified trimester: Secondary | ICD-10-CM

## 2020-10-14 DIAGNOSIS — O3442 Maternal care for other abnormalities of cervix, second trimester: Secondary | ICD-10-CM

## 2020-10-30 ENCOUNTER — Encounter: Payer: BC Managed Care – PPO | Admitting: Family Medicine

## 2020-10-31 ENCOUNTER — Other Ambulatory Visit: Payer: Self-pay | Admitting: Family Medicine

## 2020-10-31 ENCOUNTER — Ambulatory Visit (INDEPENDENT_AMBULATORY_CARE_PROVIDER_SITE_OTHER): Payer: BC Managed Care – PPO | Admitting: Family Medicine

## 2020-10-31 ENCOUNTER — Other Ambulatory Visit: Payer: Self-pay

## 2020-10-31 VITALS — BP 109/65 | HR 93 | Wt 146.0 lb

## 2020-10-31 DIAGNOSIS — Z3480 Encounter for supervision of other normal pregnancy, unspecified trimester: Secondary | ICD-10-CM

## 2020-10-31 DIAGNOSIS — Z9889 Other specified postprocedural states: Secondary | ICD-10-CM

## 2020-10-31 DIAGNOSIS — F988 Other specified behavioral and emotional disorders with onset usually occurring in childhood and adolescence: Secondary | ICD-10-CM

## 2020-10-31 DIAGNOSIS — N841 Polyp of cervix uteri: Secondary | ICD-10-CM

## 2020-10-31 DIAGNOSIS — O09292 Supervision of pregnancy with other poor reproductive or obstetric history, second trimester: Secondary | ICD-10-CM

## 2020-10-31 DIAGNOSIS — Z3A20 20 weeks gestation of pregnancy: Secondary | ICD-10-CM

## 2020-10-31 DIAGNOSIS — Z8759 Personal history of other complications of pregnancy, childbirth and the puerperium: Secondary | ICD-10-CM

## 2020-10-31 NOTE — Progress Notes (Signed)
   PRENATAL VISIT NOTE  Subjective:  Debbie Harrison is a 35 y.o. [phone] at [redacted]w[redacted]d being seen today for ongoing prenatal care.  She is currently monitored for the following issues for this high-risk pregnancy and has Cervical abnormality affecting pregnancy, antepartum; Supervision of other normal pregnancy, antepartum; Acne; Attention deficit disorder; History of PCOS; Hyperhidrosis; Spina bifida in child of prior pregnancy, currently pregnant; and History of preterm premature rupture of membranes (PPROM) on their problem list.  Patient reports no complaints.  Contractions: Not present. Vag. Bleeding: None.  Movement: Present. Denies leaking of fluid.   The following portions of the patient's history were reviewed and updated as appropriate: allergies, current medications, past family history, past medical history, past social history, past surgical history and problem list.   Objective:   Vitals:   10/31/20 1054  BP: 109/65  Pulse: 93  Weight: 146 lb (66.2 kg)    Fetal Status: Fetal Heart Rate (bpm): 148 Fundal Height: 20 cm Movement: Present     General:  Alert, oriented and cooperative. Patient is in no acute distress.  Skin: Skin is warm and dry. No rash noted.   Cardiovascular: Normal heart rate noted  Respiratory: Normal respiratory effort, no problems with respiration noted  Abdomen: Soft, gravid, appropriate for gestational age.  Pain/Pressure: Absent     Pelvic: Cervical exam deferred        Extremities: Normal range of motion.  Edema: None  Mental Status: Normal mood and affect. Normal behavior. Normal judgment and thought content.   Assessment and Plan:  Pregnancy: G3P0111 at [redacted]w[redacted]d 1. [redacted] weeks gestation of pregnancy  2. Supervision of other normal pregnancy, antepartum FHT and FH normal  3. H/O LEEP CL normal  4. History of preterm premature rupture of membranes (PPROM)  5. Spina bifida in child of prior pregnancy, currently pregnant in second trimester AFP  normal [place] normal  6. Cervical polyp No bleeding present  7. Attention deficit disorder, unspecified hyperactivity presence   Preterm labor symptoms and general obstetric precautions including but not limited to vaginal bleeding, contractions, leaking of fluid and fetal movement were reviewed in detail with the patient. Please refer to After Visit Summary for other counseling recommendations.   Return in about 4 weeks (around 11/28/2020) for OB f/u.  Future Appointments  Date Time Provider Department Center  11/11/2020 11:00 AM [facility] NURSE [facility]  11/11/2020 11:15 AM [facility]  11/27/2020  9:30 AM Levie Heritage, DO [facility] None    Levie Heritage, DO

## 2020-10-31 NOTE — Progress Notes (Signed)
+   fetal movement. Pt c/o increased bilateral leg pain. States it happens at all times during the day.

## 2020-11-11 ENCOUNTER — Ambulatory Visit: Payer: BC Managed Care – PPO

## 2020-11-27 ENCOUNTER — Ambulatory Visit (INDEPENDENT_AMBULATORY_CARE_PROVIDER_SITE_OTHER): Payer: BC Managed Care – PPO | Admitting: Family Medicine

## 2020-11-27 ENCOUNTER — Other Ambulatory Visit: Payer: Self-pay

## 2020-11-27 VITALS — BP 107/61 | HR 75 | Wt 150.0 lb

## 2020-11-27 DIAGNOSIS — Z3480 Encounter for supervision of other normal pregnancy, unspecified trimester: Secondary | ICD-10-CM

## 2020-11-27 DIAGNOSIS — O09292 Supervision of pregnancy with other poor reproductive or obstetric history, second trimester: Secondary | ICD-10-CM

## 2020-11-27 DIAGNOSIS — Z3A24 24 weeks gestation of pregnancy: Secondary | ICD-10-CM

## 2020-11-27 DIAGNOSIS — O3440 Maternal care for other abnormalities of cervix, unspecified trimester: Secondary | ICD-10-CM

## 2020-11-27 DIAGNOSIS — Z8759 Personal history of other complications of pregnancy, childbirth and the puerperium: Secondary | ICD-10-CM

## 2020-11-27 DIAGNOSIS — F988 Other specified behavioral and emotional disorders with onset usually occurring in childhood and adolescence: Secondary | ICD-10-CM

## 2020-11-27 NOTE — Progress Notes (Signed)
+   fetal movement. No complaints.  

## 2020-11-27 NOTE — Progress Notes (Signed)
   PRENATAL VISIT NOTE  Subjective:  Debbie Harrison is a 35 y.o. [phone] at [redacted]w[redacted]d being seen today for ongoing prenatal care.  She is currently monitored for the following issues for this high-risk pregnancy and has Cervical abnormality affecting pregnancy, antepartum; Supervision of other normal pregnancy, antepartum; Acne; Attention deficit disorder; History of PCOS; Hyperhidrosis; Spina bifida in child of prior pregnancy, currently pregnant; and History of preterm premature rupture of membranes (PPROM) on their problem list.  Patient reports no complaints.  Contractions: Not present. Vag. Bleeding: None.  Movement: Present. Denies leaking of fluid.   The following portions of the patient's history were reviewed and updated as appropriate: allergies, current medications, past family history, past medical history, past social history, past surgical history and problem list.   Objective:   Vitals:   11/27/20 0925  BP: 107/61  Pulse: 75  Weight: 150 lb (68 kg)    Fetal Status: Fetal Heart Rate (bpm): 147   Movement: Present     General:  Alert, oriented and cooperative. Patient is in no acute distress.  Skin: Skin is warm and dry. No rash noted.   Cardiovascular: Normal heart rate noted  Respiratory: Normal respiratory effort, no problems with respiration noted  Abdomen: Soft, gravid, appropriate for gestational age.  Pain/Pressure: Absent     Pelvic: Cervical exam deferred        Extremities: Normal range of motion.  Edema: None  Mental Status: Normal mood and affect. Normal behavior. Normal judgment and thought content.   Assessment and Plan:  Pregnancy: G3P0111 at [redacted]w[redacted]d 1. [redacted] weeks gestation of pregnancy   2. Supervision of other normal pregnancy, antepartum FHT and FH normal  3. History of preterm premature rupture of membranes (PPROM)  4. Spina bifida in child of prior pregnancy, currently pregnant in second trimester Folic acid.  Afp neg  5. Attention deficit  disorder, unspecified hyperactivity presence  6. Cervical abnormality affecting pregnancy, antepartum   Preterm labor symptoms and general obstetric precautions including but not limited to vaginal bleeding, contractions, leaking of fluid and fetal movement were reviewed in detail with the patient. Please refer to After Visit Summary for other counseling recommendations.   No follow-ups on file.  Future Appointments  Date Time Provider Department Center  12/25/2020  8:15 AM Anyanwu, Jethro Bastos, MD [facility] None  01/10/2021  8:15 AM Levie Heritage, DO [facility] None  01/24/2021  8:15 AM Adrian Blackwater Rhona Raider, DO [facility] None    Levie Heritage, DO

## 2020-12-05 MED ORDER — PREDNISONE 20 MG PO TABS: 20.0000 mg | ORAL_TABLET | ORAL | 0 refills | Status: DC

## 2020-12-25 ENCOUNTER — Other Ambulatory Visit: Payer: Self-pay

## 2020-12-25 ENCOUNTER — Encounter: Payer: Self-pay | Admitting: Obstetrics & Gynecology

## 2020-12-25 ENCOUNTER — Ambulatory Visit (INDEPENDENT_AMBULATORY_CARE_PROVIDER_SITE_OTHER): Payer: BC Managed Care – PPO | Admitting: Obstetrics & Gynecology

## 2020-12-25 VITALS — BP 107/68 | HR 80 | Wt 154.0 lb

## 2020-12-25 DIAGNOSIS — Z3A28 28 weeks gestation of pregnancy: Secondary | ICD-10-CM

## 2020-12-25 DIAGNOSIS — Z3480 Encounter for supervision of other normal pregnancy, unspecified trimester: Secondary | ICD-10-CM | POA: Diagnosis not present

## 2020-12-25 MED ORDER — ONDANSETRON HCL 4 MG PO TABS: 4.0000 mg | ORAL_TABLET | Freq: Three times a day (TID) | ORAL | 0 refills | Status: DC | PRN

## 2020-12-25 NOTE — Progress Notes (Signed)
   PRENATAL VISIT NOTE  Subjective:  Debbie Harrison is a 35 y.o. [phone] at [redacted]w[redacted]d being seen today for ongoing prenatal care.  She is currently monitored for the following issues for this low-risk pregnancy and has Cervical abnormality affecting pregnancy, antepartum; Supervision of other normal pregnancy, antepartum; Acne; Attention deficit disorder; History of PCOS; Hyperhidrosis; Spina bifida in child of prior pregnancy, currently pregnant; and History of preterm premature rupture of membranes (PPROM) on their problem list.  Patient reports no complaints.  Contractions: Not present. Vag. Bleeding: None.  Movement: Present. Denies leaking of fluid.   The following portions of the patient's history were reviewed and updated as appropriate: allergies, current medications, past family history, past medical history, past social history, past surgical history and problem list.   Objective:   Vitals:   12/25/20 0815  BP: 107/68  Pulse: 80  Weight: 154 lb (69.9 kg)    Fetal Status: Fetal Heart Rate (bpm): 137 Fundal Height: 28 cm Movement: Present     General:  Alert, oriented and cooperative. Patient is in no acute distress.  Skin: Skin is warm and dry. No rash noted.   Cardiovascular: Normal heart rate noted  Respiratory: Normal respiratory effort, no problems with respiration noted  Abdomen: Soft, gravid, appropriate for gestational age.  Pain/Pressure: Absent     Pelvic: Cervical exam deferred        Extremities: Normal range of motion.  Edema: None  Mental Status: Normal mood and affect. Normal behavior. Normal judgment and thought content.   Assessment and Plan:  Pregnancy: G3P0111 at [redacted]w[redacted]d 1. [redacted] weeks gestation of pregnancy 2. Supervision of other normal pregnancy, antepartum Declines 2 hr GTT today and Tdap, only wants to do 1 hr GTT. Understands that if 1 hr GTT ? 135, she will need 2 hr GTT or 3 hr GTT for diagnostic testing.  Will follow up results and manage accordingly. -  CBC - HIV Antibody (routine testing w rflx) - RPR - Glucose tolerance, 1 hour Preterm labor symptoms and general obstetric precautions including but not limited to vaginal bleeding, contractions, leaking of fluid and fetal movement were reviewed in detail with the patient. Please refer to After Visit Summary for other counseling recommendations.   Return in about 2 weeks (around 01/08/2021) for OFFICE OB VISIT (MD or APP).  Future Appointments  Date Time Provider Department Center  01/10/2021  8:15 AM Levie Heritage, DO [facility] None  01/24/2021  8:15 AM Adrian Blackwater, Rhona Raider, DO [facility] None    Jaynie Collins, MD

## 2020-12-25 NOTE — Telephone Encounter (Signed)
Patient called- she was unable to complete one hour gtt due to passing out and vomiting.   Per Dr. Macon Large patient can attempt one hour gtt again but eat heavy protein breakfast and then can also have some zofran to take before test. Patient states understanding and will call back to schedule 1 hour gtt when she can have childcare and someone to drive her to appt. Armandina Stammer RN

## 2020-12-25 NOTE — Patient Instructions (Signed)
Return to office for any scheduled appointments. Call the office or go to the MAU at [facility] if: You begin to have strong, frequent contractions Your water breaks.  Sometimes it is a big gush of fluid, sometimes it is just a trickle that keeps getting your panties wet or running down your legs You have vaginal bleeding.  It is normal to have a small amount of spotting if your cervix was checked.  You do not feel your baby moving like normal.  If you do not, get something to eat and drink and lay down and focus on feeling your baby move.   If your baby is still not moving like normal, you should call the office or go to MAU. Any other obstetric concerns.  Third Trimester of Pregnancy The third trimester of pregnancy is from week 28 through week 40. This is months 7 through 9. The third trimester is a time when the unborn baby (fetus) is growing rapidly. At the end of the ninth month, the fetus is about 20 inches long and weighs 6-10 pounds. Body changes during your third trimester During the third trimester, your body will continue to go through many changes. The changes vary and generally return to normal after your baby is born. Physical changes Your weight will continue to increase. You can expect to gain 25-35 pounds (11-16 kg) by the end of the pregnancy if you begin pregnancy at a normal weight. If you are underweight, you can expect to gain 28-40 lb (about 13-18 kg), and if you are overweight, you can expect to gain 15-25 lb (about 7-11 kg). You may begin to get stretch marks on your hips, abdomen, and breasts. Your breasts will continue to grow and may hurt. A yellow fluid (colostrum) may leak from your breasts. This is the first milk you are producing for your baby. You may have changes in your hair. These can include thickening of your hair, rapid growth, and changes in texture. Some people also have hair loss during or after pregnancy, or hair that feels dry  or thin. Your belly button may stick out. You may notice more swelling in your hands, face, or ankles. Health changes You may have heartburn. You may have constipation. You may develop hemorrhoids. You may develop swollen, bulging veins (varicose veins) in your legs. You may have increased body aches in the pelvis, back, or thighs. This is due to weight gain and increased hormones that are relaxing your joints. You may have increased tingling or numbness in your hands, arms, and legs. The skin on your abdomen may also feel numb. You may feel short of breath because of your expanding uterus. Other changes You may urinate more often because the fetus is moving lower into your pelvis and pressing on your bladder. You may have more problems sleeping. This may be caused by the size of your abdomen, an increased need to urinate, and an increase in your body's metabolism. You may notice the fetus "dropping," or moving lower in your abdomen (lightening). You may have increased vaginal discharge. You may notice that you have pain around your pelvic bone as your uterus distends. Follow these instructions at home: Medicines Follow your health care provider's instructions regarding medicine use. Specific medicines may be either safe or unsafe to take during pregnancy. Do not take any medicines unless approved by your health care provider. Take a prenatal vitamin that contains at least 600 micrograms (mcg) of folic acid. Eating   and drinking Eat a healthy diet that includes fresh fruits and vegetables, whole grains, good sources of protein such as meat, eggs, or tofu, and low-fat dairy products. Avoid raw meat and unpasteurized juice, milk, and cheese. These carry germs that can harm you and your baby. Eat 4 or 5 small meals rather than 3 large meals a day. You may need to take these actions to prevent or treat constipation: Drink enough fluid to keep your urine pale yellow. Eat foods that are high in  fiber, such as beans, whole grains, and fresh fruits and vegetables. Limit foods that are high in fat and processed sugars, such as fried or sweet foods. Activity Exercise only as directed by your health care provider. Most people can continue their usual exercise routine during pregnancy. Try to exercise for 30 minutes at least 5 days a week. Stop exercising if you experience contractions in the uterus. Stop exercising if you develop pain or cramping in the lower abdomen or lower back. Avoid heavy lifting. Do not exercise if it is very hot or humid or if you are at a high altitude. If you choose to, you may continue to have sex unless your health care provider tells you not to. Relieving pain and discomfort Take frequent breaks and rest with your legs raised (elevated) if you have leg cramps or low back pain. Take warm sitz baths to soothe any pain or discomfort caused by hemorrhoids. Use hemorrhoid cream if your health care provider approves. Wear a supportive bra to prevent discomfort from breast tenderness. If you develop varicose veins: Wear support hose as told by your health care provider. Elevate your feet for 15 minutes, 3-4 times a day. Limit salt in your diet. Safety Talk to your health care provider before traveling far distances. Do not use hot tubs, steam rooms, or saunas. Wear your seat belt at all times when driving or riding in a car. Talk with your health care provider if someone is verbally or physically abusive to you. Preparing for birth To prepare for the arrival of your baby: Take prenatal classes to understand, practice, and ask questions about labor and delivery. Visit the hospital and tour the maternity area. Purchase a rear-facing car seat and make sure you know how to install it in your car. Prepare the baby's room or sleeping area. Make sure to remove all pillows and stuffed animals from the baby's crib to prevent suffocation. General instructions Avoid cat  litter boxes and soil used by cats. These carry germs that can cause birth defects in the baby. If you have a cat, ask someone to clean the litter box for you. Do not douche or use tampons. Do not use scented sanitary pads. Do not use any products that contain nicotine or tobacco, such as cigarettes, e-cigarettes, and chewing tobacco. If you need help quitting, ask your health care provider. Do not use any herbal remedies, illegal drugs, or medicines that were not prescribed to you. Chemicals in these products can harm your baby. Do not drink alcohol. You will have more frequent prenatal exams during the third trimester. During a routine prenatal visit, your health care provider will do a physical exam, perform tests, and discuss your overall health. Keep all follow-up visits. This is important. Where to find more information American Pregnancy Association: americanpregnancy.org American College of Obstetricians and Gynecologists: acog.org/en/Womens%20Health/Pregnancy Office on Women's Health: womenshealth.gov/pregnancy Contact a health care provider if you have: A fever. Mild pelvic cramps, pelvic pressure, or nagging pain in   your abdominal area or lower back. Vomiting or diarrhea. Bad-smelling vaginal discharge or foul-smelling urine. Pain when you urinate. A headache that does not go away when you take medicine. Visual changes or see spots in front of your eyes. Get help right away if: Your water breaks. You have regular contractions less than 5 minutes apart. You have spotting or bleeding from your vagina. You have severe abdominal pain. You have difficulty breathing. You have chest pain. You have fainting spells. You have not felt your baby move for the time period told by your health care provider. You have new or increased pain, swelling, or redness in an arm or leg. Summary The third trimester of pregnancy is from week 28 through week 40 (months 7 through 9). You may have more  problems sleeping. This can be caused by the size of your abdomen, an increased need to urinate, and an increase in your body's metabolism. You will have more frequent prenatal exams during the third trimester. Keep all follow-up visits. This is important. This information is not intended to replace advice given to you by your health care provider. Make sure you discuss any questions you have with your health care provider. Document Revised: 10/18/2019 Document Reviewed: 08/24/2019 Elsevier Patient Education  2022 [employer]  

## 2020-12-26 LAB — CBC
Hematocrit: 35.6 % (ref 34.0–46.6)
Hemoglobin: 11.8 g/dL (ref 11.1–15.9)
MCH: 30.2 pg (ref 26.6–33.0)
MCHC: 33.1 g/dL (ref 31.5–35.7)
MCV: 91 fL (ref 79–97)
Platelets: 207 10*3/uL (ref 150–450)
RBC: 3.91 x10E6/uL (ref 3.77–5.28)
RDW: 12.4 % (ref 11.7–15.4)
WBC: 10.9 10*3/uL — ABNORMAL HIGH (ref 3.4–10.8)

## 2020-12-26 LAB — RPR: RPR Ser Ql: NONREACTIVE

## 2020-12-26 LAB — HIV ANTIBODY (ROUTINE TESTING W REFLEX): HIV Screen 4th Generation wRfx: NONREACTIVE

## 2021-01-10 ENCOUNTER — Other Ambulatory Visit: Payer: Self-pay | Admitting: Family Medicine

## 2021-01-10 ENCOUNTER — Other Ambulatory Visit: Payer: Self-pay

## 2021-01-10 ENCOUNTER — Encounter: Payer: Self-pay | Admitting: Family Medicine

## 2021-01-10 ENCOUNTER — Telehealth (INDEPENDENT_AMBULATORY_CARE_PROVIDER_SITE_OTHER): Payer: BC Managed Care – PPO | Admitting: Family Medicine

## 2021-01-10 VITALS — BP 113/77

## 2021-01-10 DIAGNOSIS — Z3403 Encounter for supervision of normal first pregnancy, third trimester: Secondary | ICD-10-CM

## 2021-01-10 DIAGNOSIS — Z20822 Contact with and (suspected) exposure to covid-19: Secondary | ICD-10-CM

## 2021-01-10 DIAGNOSIS — Z3A31 31 weeks gestation of pregnancy: Secondary | ICD-10-CM

## 2021-01-10 DIAGNOSIS — O99343 Other mental disorders complicating pregnancy, third trimester: Secondary | ICD-10-CM

## 2021-01-10 DIAGNOSIS — F988 Other specified behavioral and emotional disorders with onset usually occurring in childhood and adolescence: Secondary | ICD-10-CM

## 2021-01-10 DIAGNOSIS — Z8759 Personal history of other complications of pregnancy, childbirth and the puerperium: Secondary | ICD-10-CM

## 2021-01-10 DIAGNOSIS — F909 Attention-deficit hyperactivity disorder, unspecified type: Secondary | ICD-10-CM

## 2021-01-10 DIAGNOSIS — O09293 Supervision of pregnancy with other poor reproductive or obstetric history, third trimester: Secondary | ICD-10-CM

## 2021-01-10 DIAGNOSIS — O09292 Supervision of pregnancy with other poor reproductive or obstetric history, second trimester: Secondary | ICD-10-CM

## 2021-01-10 DIAGNOSIS — O3440 Maternal care for other abnormalities of cervix, unspecified trimester: Secondary | ICD-10-CM

## 2021-01-10 DIAGNOSIS — O3443 Maternal care for other abnormalities of cervix, third trimester: Secondary | ICD-10-CM

## 2021-01-10 MED ORDER — ACCU-CHEK SOFTCLIX LANCETS MISC: 1.0000 | Freq: Four times a day (QID) | 1 refills | Status: DC

## 2021-01-10 MED ORDER — GLUCOSE BLOOD VI STRP: ORAL_STRIP | 1 refills | Status: DC

## 2021-01-10 MED ORDER — ACCU-CHEK NANO SMARTVIEW W/DEVICE KIT: 1.0000 | PACK | 0 refills | Status: DC

## 2021-01-10 MED ORDER — NIRMATRELVIR/RITONAVIR (PAXLOVID)TABLET: 3.0000 | ORAL_TABLET | Freq: Two times a day (BID) | ORAL | 0 refills | Status: AC

## 2021-01-10 MED ORDER — ACCU-CHEK GUIDE VI STRP
1.0000 | ORAL_STRIP | Freq: Four times a day (QID) | 1 refills | Status: DC
Start: 2021-01-10 — End: 2021-02-21

## 2021-01-10 NOTE — Progress Notes (Signed)
OBSTETRICS PRENATAL VIRTUAL VISIT ENCOUNTER NOTE  Provider location: Center for [facility] at [facility]   Patient location: Home  I connected with Debbie Harrison on 01/10/21 at  8:15 AM EDT by MyChart Video Encounter and verified that I am speaking with the correct person using two identifiers. I discussed the limitations, risks, security and privacy concerns of performing an evaluation and management service virtually and the availability of in person appointments. I also discussed with the patient that there may be a patient responsible charge related to this service. The patient expressed understanding and agreed to proceed. Subjective:  Debbie Harrison is a 35 y.o. [phone] at [redacted]w[redacted]d being seen today for ongoing prenatal care.  She is currently monitored for the following issues for this high-risk pregnancy and has Cervical abnormality affecting pregnancy, antepartum; Encounter for supervision of normal first pregnancy in third trimester; Acne; Attention deficit disorder; History of PCOS; Hyperhidrosis; Spina bifida in child of prior pregnancy, currently pregnant; and History of preterm premature rupture of membranes (PPROM) on their problem list.  Patient reports no complaints.  Contractions: Not present. Vag. Bleeding: None.  Movement: Present. Denies any leaking of fluid.   The following portions of the patient's history were reviewed and updated as appropriate: allergies, current medications, past family history, past medical history, past social history, past surgical history and problem list.   Objective:   Vitals:   01/10/21 0811  BP: 113/77    Fetal Status:     Movement: Present     General:  Alert, oriented and cooperative. Patient is in no acute distress.  Respiratory: Normal respiratory effort, no problems with respiration noted  Mental Status: Normal mood and affect. Normal behavior. Normal judgment and thought content.  Rest of physical exam deferred due  to type of encounter  Imaging: No results found.  Assessment and Plan:  Pregnancy: [id number] at [redacted]w[redacted]d 1. Encounter for supervision of normal first pregnancy in third trimester Good fetal movement.  2. Spina bifida in child of prior pregnancy, currently pregnant in second trimester Screening negative for this pregnancy Folic acid 1mg  daily  3. 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 2022 - normal cytology and neg HPV.  4. History of preterm premature rupture of membranes (PPROM)  5. Attention deficit disorder, unspecified hyperactivity presence  6. Exposure to COVID-19 virus Son has COVID, but she isn't having any symptoms. Paxlovid prescribed in case she starts developing symptoms this weekend. Discussed other symptomatic treatments.  Preterm labor symptoms and general obstetric precautions including but not limited to vaginal bleeding, contractions, leaking of fluid and fetal movement were reviewed in detail with the patient. I discussed the assessment and treatment plan with the patient. The patient was provided an opportunity to ask questions and all were answered. The patient agreed with the plan and demonstrated an understanding of the instructions. The patient was advised to call back or seek an in-person office evaluation/go to MAU at [facility] for any urgent or concerning symptoms. Please refer to After Visit Summary for other counseling recommendations.   I provided 11 minutes of face-to-face time during this encounter.  No follow-ups on file.  Future Appointments  Date Time Provider Department Center  01/24/2021  8:15 AM 03/26/2021, DO [facility] None  02/07/2021  8:15 AM 02/09/2021, MD [facility] None  02/21/2021  8:15 AM 02/23/2021, DO [facility] None    Levie Heritage, DO Center for [facility]ie Heritage, [facility]

## 2021-01-24 ENCOUNTER — Other Ambulatory Visit: Payer: Self-pay

## 2021-01-24 ENCOUNTER — Ambulatory Visit (INDEPENDENT_AMBULATORY_CARE_PROVIDER_SITE_OTHER): Payer: BC Managed Care – PPO | Admitting: Family Medicine

## 2021-01-24 VITALS — BP 122/77 | HR 88 | Wt 157.0 lb

## 2021-01-24 DIAGNOSIS — Z8759 Personal history of other complications of pregnancy, childbirth and the puerperium: Secondary | ICD-10-CM

## 2021-01-24 DIAGNOSIS — Z20822 Contact with and (suspected) exposure to covid-19: Secondary | ICD-10-CM

## 2021-01-24 DIAGNOSIS — Z3403 Encounter for supervision of normal first pregnancy, third trimester: Secondary | ICD-10-CM

## 2021-01-24 DIAGNOSIS — O09292 Supervision of pregnancy with other poor reproductive or obstetric history, second trimester: Secondary | ICD-10-CM

## 2021-01-24 NOTE — Progress Notes (Signed)
   PRENATAL VISIT NOTE  Subjective:  Debbie Harrison is a 35 y.o. [phone] at [redacted]w[redacted]d being seen today for ongoing prenatal care.  She is currently monitored for the following issues for this high-risk pregnancy and has Encounter for supervision of normal first pregnancy in third trimester; Acne; Attention deficit disorder; History of PCOS; Hyperhidrosis; Spina bifida in child of prior pregnancy, currently pregnant; and History of preterm premature rupture of membranes (PPROM) on their problem list.  Patient reports no complaints.  Contractions: Not present. Vag. Bleeding: None.  Movement: Present. Denies leaking of fluid.   The following portions of the patient's history were reviewed and updated as appropriate: allergies, current medications, past family history, past medical history, past social history, past surgical history and problem list.   Objective:   Vitals:   01/24/21 0819  BP: 122/77  Pulse: 88  Weight: 157 lb (71.2 kg)    Fetal Status: Fetal Heart Rate (bpm): 160   Movement: Present     General:  Alert, oriented and cooperative. Patient is in no acute distress.  Skin: Skin is warm and dry. No rash noted.   Cardiovascular: Normal heart rate noted  Respiratory: Normal respiratory effort, no problems with respiration noted  Abdomen: Soft, gravid, appropriate for gestational age.  Pain/Pressure: Absent     Pelvic: Cervical exam deferred        Extremities: Normal range of motion.  Edema: None  Mental Status: Normal mood and affect. Normal behavior. Normal judgment and thought content.   Assessment and Plan:  Pregnancy: A1O8786 at [redacted]w[redacted]d 1. Encounter for supervision of normal first pregnancy in third trimester FHT and FH normal. Patient gets ill with glucola. Will check CBGs for 2 weeks.  2. Spina bifida in child of prior pregnancy, currently pregnant in second trimester Folic acid supplementation. Neg AFP  3. Exposure to COVID-19 virus Tested positive 8/22  4. History  of preterm premature rupture of membranes (PPROM)   Preterm labor symptoms and general obstetric precautions including but not limited to vaginal bleeding, contractions, leaking of fluid and fetal movement were reviewed in detail with the patient. Please refer to After Visit Summary for other counseling recommendations.   No follow-ups on file.  Future Appointments  Date Time Provider Department Center  02/07/2021  8:15 AM Venora Maples, MD [facility] None  02/21/2021  8:15 AM Levie Heritage, DO [facility] None    Levie Heritage, DO

## 2021-01-24 NOTE — Addendum Note (Signed)
Addended by: Levie Heritage on: 01/24/2021 08:42 AM   Modules accepted: Orders

## 2021-02-07 ENCOUNTER — Encounter: Payer: Self-pay | Admitting: Family Medicine

## 2021-02-07 ENCOUNTER — Telehealth (INDEPENDENT_AMBULATORY_CARE_PROVIDER_SITE_OTHER): Payer: BC Managed Care – PPO | Admitting: Family Medicine

## 2021-02-07 DIAGNOSIS — Z3403 Encounter for supervision of normal first pregnancy, third trimester: Secondary | ICD-10-CM

## 2021-02-07 DIAGNOSIS — Z3A35 35 weeks gestation of pregnancy: Secondary | ICD-10-CM

## 2021-02-07 DIAGNOSIS — Z8759 Personal history of other complications of pregnancy, childbirth and the puerperium: Secondary | ICD-10-CM

## 2021-02-07 DIAGNOSIS — O09293 Supervision of pregnancy with other poor reproductive or obstetric history, third trimester: Secondary | ICD-10-CM

## 2021-02-07 NOTE — Progress Notes (Signed)
I connected with Debbie Harrison 02/07/21 at  8:15 AM EDT by: MyChart video and verified that I am speaking with the correct person using two identifiers.  Patient is located at home and provider is located at [facility].     The purpose of this virtual visit is to provide medical care while limiting exposure to the novel coronavirus. I discussed the limitations, risks, security and privacy concerns of performing an evaluation and management service by MyChart video and the availability of in person appointments. I also discussed with the patient that there may be a patient responsible charge related to this service. By engaging in this virtual visit, you consent to the provision of healthcare.  Additionally, you authorize for your insurance to be billed for the services provided during this visit.  The patient expressed understanding and agreed to proceed.  The following staff members participated in the virtual visit:  Venora Maples, MD/MPH Attending Family Medicine Physician, Faculty [facility] for [facility], [facility]     PRENATAL VISIT NOTE  Subjective:  Debbie Harrison is a 35 y.o. [id number] at [redacted]w[redacted]d  for phone visit for ongoing prenatal care.  She is currently monitored for the following issues for this low-risk pregnancy and has Encounter for supervision of normal first pregnancy in third trimester; Acne; Attention deficit disorder; History of PCOS; Hyperhidrosis; Spina bifida in child of prior pregnancy, currently pregnant; and History of preterm premature rupture of membranes (PPROM) on their problem list.  Patient reports no complaints.  Contractions: Not present.  .  Movement: Present. Denies leaking of fluid.   The following portions of the patient's history were reviewed and updated as appropriate: allergies, current medications, past family history, past medical history, past social history, past surgical history  and problem list.   Objective:  There were no vitals filed for this visit. Self-Obtained  Fetal Status:     Movement: Present     Assessment and Plan:  Pregnancy: G3P0111 at [redacted]w[redacted]d 1. Encounter for supervision of normal first pregnancy in third trimester Good fetal movement reported Did not check finger sticks, has major blood phobia Recommended A1c at next visit, she is amenable to this Swabs next visit Still planning on husband having vasectomy, discussed this is often a six month process and recommend backup in the meantime as appt is not yet scheduled  2. Spina bifida in child of prior pregnancy, currently pregnant in third trimester Normal [place] this pregnancy, normal AFP  3. History of preterm premature rupture of membranes (PPROM) Hx of LEEP Del at 35 weeks previously  Term labor symptoms and general obstetric precautions including but not limited to vaginal bleeding, contractions, leaking of fluid and fetal movement were reviewed in detail with the patient.  Return in about 1 week (around 02/14/2021) for ob visit.  Future Appointments  Date Time Provider Department Center  02/13/2021  2:10 PM Levie Heritage, DO [facility] None  02/21/2021  8:15 AM Levie Heritage, DO [facility] None  02/27/2021  8:35 AM Levie Heritage, DO [facility] None  03/05/2021  8:35 AM Anyanwu, Jethro Bastos, MD [facility] None  03/13/2021  8:35 AM Levie Heritage, DO [facility] None     Time spent on virtual visit: 10 minutes  Venora Maples, MD

## 2021-02-13 ENCOUNTER — Ambulatory Visit (INDEPENDENT_AMBULATORY_CARE_PROVIDER_SITE_OTHER): Payer: BC Managed Care – PPO | Admitting: Family Medicine

## 2021-02-13 ENCOUNTER — Other Ambulatory Visit: Payer: Self-pay

## 2021-02-13 ENCOUNTER — Other Ambulatory Visit (HOSPITAL_COMMUNITY)
Admission: RE | Admit: 2021-02-13 | Discharge: 2021-02-13 | Disposition: A | Discharge status: Home / Self Care | Payer: BC Managed Care – PPO | Source: Ambulatory Visit | Attending: Family Medicine | Admitting: Family Medicine

## 2021-02-13 VITALS — BP 115/71 | HR 64 | Wt 160.0 lb

## 2021-02-13 DIAGNOSIS — O99019 Anemia complicating pregnancy, unspecified trimester: Secondary | ICD-10-CM | POA: Diagnosis not present

## 2021-02-13 DIAGNOSIS — Z3403 Encounter for supervision of normal first pregnancy, third trimester: Secondary | ICD-10-CM | POA: Diagnosis not present

## 2021-02-13 DIAGNOSIS — Z3A35 35 weeks gestation of pregnancy: Secondary | ICD-10-CM | POA: Diagnosis not present

## 2021-02-13 DIAGNOSIS — O09293 Supervision of pregnancy with other poor reproductive or obstetric history, third trimester: Secondary | ICD-10-CM

## 2021-02-13 DIAGNOSIS — D509 Iron deficiency anemia, unspecified: Secondary | ICD-10-CM

## 2021-02-13 DIAGNOSIS — Z8759 Personal history of other complications of pregnancy, childbirth and the puerperium: Secondary | ICD-10-CM

## 2021-02-13 NOTE — Progress Notes (Signed)
   PRENATAL VISIT NOTE  Subjective:  Debbie Harrison is a 35 y.o. [phone] at [redacted]w[redacted]d being seen today for ongoing prenatal care.  She is currently monitored for the following issues for this high-risk pregnancy and has Encounter for supervision of normal first pregnancy in third trimester; Acne; Attention deficit disorder; History of PCOS; Hyperhidrosis; Spina bifida in child of prior pregnancy, currently pregnant; and History of preterm premature rupture of membranes (PPROM) on their problem list.  Patient reports no complaints.  Contractions: Not present. Vag. Bleeding: None.  Movement: Present. Denies leaking of fluid.   The following portions of the patient's history were reviewed and updated as appropriate: allergies, current medications, past family history, past medical history, past social history, past surgical history and problem list.   Objective:   Vitals:   02/13/21 1415  BP: 115/71  Pulse: 64  Weight: 160 lb (72.6 kg)    Fetal Status: Fetal Heart Rate (bpm): 131 Fundal Height: 35 cm Movement: Present     General:  Alert, oriented and cooperative. Patient is in no acute distress.  Skin: Skin is warm and dry. No rash noted.   Cardiovascular: Normal heart rate noted  Respiratory: Normal respiratory effort, no problems with respiration noted  Abdomen: Soft, gravid, appropriate for gestational age.  Pain/Pressure: Absent     Pelvic: Cervical exam deferred        Extremities: Normal range of motion.  Edema: None  Mental Status: Normal mood and affect. Normal behavior. Normal judgment and thought content.   Assessment and Plan:  Pregnancy: G3P0111 at [redacted]w[redacted]d 1. [redacted] weeks gestation of pregnancy - CBC  2. Encounter for supervision of normal first pregnancy in third trimester FHT and FH normal - Culture, beta strep (group b only) - GC/Chlamydia probe amp ([place])not at [facility]  3. History of preterm premature rupture of membranes (PPROM)  4. Iron deficiency anemia during  pregnancy On iron. Recheck CBC - CBC  5. Spina bifida in child of prior pregnancy, currently pregnant in third trimester On folic acid  Preterm labor symptoms and general obstetric precautions including but not limited to vaginal bleeding, contractions, leaking of fluid and fetal movement were reviewed in detail with the patient. Please refer to After Visit Summary for other counseling recommendations.   No follow-ups on file.  Future Appointments  Date Time Provider Department Center  02/21/2021  8:15 AM Levie Heritage, DO [facility] None  02/27/2021  8:35 AM Levie Heritage, DO [facility] None  03/05/2021  8:35 AM Anyanwu, Jethro Bastos, MD [facility] None  03/13/2021  8:35 AM Levie Heritage, DO [facility] None    Levie Heritage, DO

## 2021-02-14 LAB — CBC
Hematocrit: 35.2 % (ref 34.0–46.6)
Hemoglobin: 11.9 g/dL (ref 11.1–15.9)
MCH: 29.4 pg (ref 26.6–33.0)
MCHC: 33.8 g/dL (ref 31.5–35.7)
MCV: 87 fL (ref 79–97)
Platelets: 202 10*3/uL (ref 150–450)
RBC: 4.05 x10E6/uL (ref 3.77–5.28)
RDW: 12.5 % (ref 11.7–15.4)
WBC: 10.7 10*3/uL (ref 3.4–10.8)

## 2021-02-14 LAB — GC/CHLAMYDIA PROBE AMP (~~LOC~~) NOT AT ARMC
Chlamydia: NEGATIVE
Comment: NEGATIVE
Comment: NORMAL
Neisseria Gonorrhea: NEGATIVE

## 2021-02-17 LAB — CULTURE, BETA STREP (GROUP B ONLY): Strep Gp B Culture: NEGATIVE

## 2021-02-21 ENCOUNTER — Ambulatory Visit (INDEPENDENT_AMBULATORY_CARE_PROVIDER_SITE_OTHER): Payer: BC Managed Care – PPO | Admitting: Family Medicine

## 2021-02-21 ENCOUNTER — Other Ambulatory Visit: Payer: Self-pay

## 2021-02-21 VITALS — BP 117/84 | HR 120 | Wt 162.0 lb

## 2021-02-21 DIAGNOSIS — Z3403 Encounter for supervision of normal first pregnancy, third trimester: Secondary | ICD-10-CM

## 2021-02-21 DIAGNOSIS — O09293 Supervision of pregnancy with other poor reproductive or obstetric history, third trimester: Secondary | ICD-10-CM

## 2021-02-21 DIAGNOSIS — Z3A37 37 weeks gestation of pregnancy: Secondary | ICD-10-CM

## 2021-02-21 NOTE — Progress Notes (Signed)
   PRENATAL VISIT NOTE  Subjective:  Debbie Harrison is a 35 y.o. [phone] at [redacted]w[redacted]d being seen today for ongoing prenatal care.  She is currently monitored for the following issues for this low-risk pregnancy and has Encounter for supervision of normal first pregnancy in third trimester; Acne; Attention deficit disorder; History of PCOS; Hyperhidrosis; Spina bifida in child of prior pregnancy, currently pregnant; and History of preterm premature rupture of membranes (PPROM) on their problem list.  Patient reports no complaints.  Contractions: Not present. Vag. Bleeding: None.  Movement: Present. Denies leaking of fluid.   The following portions of the patient's history were reviewed and updated as appropriate: allergies, current medications, past family history, past medical history, past social history, past surgical history and problem list.   Objective:   Vitals:   02/21/21 0809  BP: 117/84  Pulse: (!) 120  Weight: 162 lb (73.5 kg)    Fetal Status: Fetal Heart Rate (bpm): 155 Fundal Height: 37 cm Movement: Present  Presentation: Vertex  General:  Alert, oriented and cooperative. Patient is in no acute distress.  Skin: Skin is warm and dry. No rash noted.   Cardiovascular: Normal heart rate noted  Respiratory: Normal respiratory effort, no problems with respiration noted  Abdomen: Soft, gravid, appropriate for gestational age.  Pain/Pressure: Present     Pelvic: Cervical exam deferred        Extremities: Normal range of motion.  Edema: Trace  Mental Status: Normal mood and affect. Normal behavior. Normal judgment and thought content.   Assessment and Plan:  Pregnancy: G3P0111 at [redacted]w[redacted]d 1. [redacted] weeks gestation of pregnancy  2. Encounter for supervision of normal first pregnancy in third trimester FHT and FH normal  3. Spina bifida in child of prior pregnancy, currently pregnant in third trimester On folic acid.   Preterm labor symptoms and general obstetric precautions including  but not limited to vaginal bleeding, contractions, leaking of fluid and fetal movement were reviewed in detail with the patient. Please refer to After Visit Summary for other counseling recommendations.   No follow-ups on file.  Future Appointments  Date Time Provider Department Center  02/27/2021  8:35 AM Levie Heritage, DO [facility] None  03/05/2021  1:10 PM Willodean Rosenthal, MD [facility] None  03/13/2021  8:35 AM Levie Heritage, DO [facility] None    Levie Heritage, DO

## 2021-02-27 ENCOUNTER — Other Ambulatory Visit: Payer: Self-pay

## 2021-02-27 ENCOUNTER — Ambulatory Visit (INDEPENDENT_AMBULATORY_CARE_PROVIDER_SITE_OTHER): Payer: BC Managed Care – PPO | Admitting: Family Medicine

## 2021-02-27 VITALS — BP 113/75 | HR 81 | Wt 163.0 lb

## 2021-02-27 DIAGNOSIS — Z3403 Encounter for supervision of normal first pregnancy, third trimester: Secondary | ICD-10-CM

## 2021-02-27 DIAGNOSIS — O09293 Supervision of pregnancy with other poor reproductive or obstetric history, third trimester: Secondary | ICD-10-CM

## 2021-02-27 DIAGNOSIS — Z3A37 37 weeks gestation of pregnancy: Secondary | ICD-10-CM

## 2021-02-27 DIAGNOSIS — F988 Other specified behavioral and emotional disorders with onset usually occurring in childhood and adolescence: Secondary | ICD-10-CM

## 2021-02-27 NOTE — Progress Notes (Signed)
   PRENATAL VISIT NOTE  Subjective:  Debbie Harrison is a 35 y.o. [phone] at [redacted]w[redacted]d being seen today for ongoing prenatal care.  She is currently monitored for the following issues for this low-risk pregnancy and has Encounter for supervision of normal first pregnancy in third trimester; Acne; Attention deficit disorder; History of PCOS; Hyperhidrosis; Spina bifida in child of prior pregnancy, currently pregnant; and History of preterm premature rupture of membranes (PPROM) on their problem list.  Patient reports occasional contractions.  Contractions: Not present. Vag. Bleeding: None.  Movement: Present. Denies leaking of fluid.   The following portions of the patient's history were reviewed and updated as appropriate: allergies, current medications, past family history, past medical history, past social history, past surgical history and problem list.   Objective:   Vitals:   02/27/21 0835  BP: 113/75  Pulse: 81  Weight: 163 lb (73.9 kg)    Fetal Status: Fetal Heart Rate (bpm): 145 Fundal Height: 37 cm Movement: Present     General:  Alert, oriented and cooperative. Patient is in no acute distress.  Skin: Skin is warm and dry. No rash noted.   Cardiovascular: Normal heart rate noted  Respiratory: Normal respiratory effort, no problems with respiration noted  Abdomen: Soft, gravid, appropriate for gestational age.  Pain/Pressure: Present     Pelvic: Cervical exam deferred        Extremities: Normal range of motion.  Edema: Trace  Mental Status: Normal mood and affect. Normal behavior. Normal judgment and thought content.   Assessment and Plan:  Pregnancy: G3P0111 at [redacted]w[redacted]d 1. [redacted] weeks gestation of pregnancy FHT and FH normal. BP normal Schedule BPP after due date  2. Encounter for supervision of normal first pregnancy in third trimester  3. Spina bifida in child of prior pregnancy, currently pregnant in third trimester On folic acid  4. Attention deficit disorder, unspecified  hyperactivity presence   Term labor symptoms and general obstetric precautions including but not limited to vaginal bleeding, contractions, leaking of fluid and fetal movement were reviewed in detail with the patient. Please refer to After Visit Summary for other counseling recommendations.   No follow-ups on file.  Future Appointments  Date Time Provider Department Center  03/05/2021  1:10 PM Willodean Rosenthal, MD [facility] None  03/13/2021  8:35 AM Levie Heritage, DO [facility] None    Levie Heritage, DO

## 2021-03-04 ENCOUNTER — Other Ambulatory Visit: Payer: Self-pay

## 2021-03-04 ENCOUNTER — Ambulatory Visit (INDEPENDENT_AMBULATORY_CARE_PROVIDER_SITE_OTHER): Payer: BC Managed Care – PPO

## 2021-03-04 VITALS — BP 134/88 | HR 69

## 2021-03-04 DIAGNOSIS — O09293 Supervision of pregnancy with other poor reproductive or obstetric history, third trimester: Secondary | ICD-10-CM

## 2021-03-04 DIAGNOSIS — Z3403 Encounter for supervision of normal first pregnancy, third trimester: Secondary | ICD-10-CM

## 2021-03-04 DIAGNOSIS — Z3A38 38 weeks gestation of pregnancy: Secondary | ICD-10-CM

## 2021-03-04 NOTE — Progress Notes (Addendum)
Patient got bp reading at home of 132/92 this morning.Patient states she has had a slight headache this morning. Patient denies any dizziness or blurry vision. Patient will take BP in the morning and if it is an elevated reading she will come in for her OB appointment in the afternoon. Plans to keep virtual visit for tomorrow.   Armandina Stammer RN   Attestation of Attending Supervision of RN: Evaluation and management procedures were performed by the nurse under my supervision and collaboration.  I have reviewed the nursing note and chart, and I agree with the management and plan.  Carolyn L. Harraway-Smith, M.D., [facility] Core

## 2021-03-05 ENCOUNTER — Encounter: Payer: BC Managed Care – PPO | Admitting: Obstetrics & Gynecology

## 2021-03-05 ENCOUNTER — Telehealth (INDEPENDENT_AMBULATORY_CARE_PROVIDER_SITE_OTHER): Payer: BC Managed Care – PPO | Admitting: Obstetrics & Gynecology

## 2021-03-05 ENCOUNTER — Encounter: Payer: Self-pay | Admitting: Obstetrics & Gynecology

## 2021-03-05 VITALS — BP 113/81 | HR 72

## 2021-03-05 DIAGNOSIS — Z3403 Encounter for supervision of normal first pregnancy, third trimester: Secondary | ICD-10-CM

## 2021-03-05 DIAGNOSIS — O09293 Supervision of pregnancy with other poor reproductive or obstetric history, third trimester: Secondary | ICD-10-CM

## 2021-03-05 DIAGNOSIS — Z8759 Personal history of other complications of pregnancy, childbirth and the puerperium: Secondary | ICD-10-CM

## 2021-03-05 DIAGNOSIS — Z3A38 38 weeks gestation of pregnancy: Secondary | ICD-10-CM

## 2021-03-05 NOTE — Progress Notes (Signed)
   OBSTETRICS PRENATAL VIRTUAL VISIT ENCOUNTER NOTE  Provider location: Center for [facility] at [facility]   Patient location: Home  I connected with Debbie Harrison on 03/05/21 at  1:30 PM EDT by MyChart Video Encounter and verified that I am speaking with the correct person using two identifiers. I discussed the limitations, risks, security and privacy concerns of performing an evaluation and management service virtually and the availability of in person appointments. I also discussed with the patient that there may be a patient responsible charge related to this service. The patient expressed understanding and agreed to proceed. Subjective:  Debbie Harrison is a 35 y.o. [phone] at [redacted]w[redacted]d being seen today for ongoing prenatal care.  She is currently monitored for the following issues for this low-risk pregnancy and has Encounter for supervision of normal first pregnancy in third trimester; Acne; Attention deficit disorder; History of PCOS; Hyperhidrosis; Spina bifida in child of prior pregnancy, currently pregnant; and History of preterm premature rupture of membranes (PPROM) on their problem list.  Patient reports occasional contractions.  Contractions: Irritability. Vag. Bleeding: None.  Movement: Present. Denies any leaking of fluid.   The following portions of the patient's history were reviewed and updated as appropriate: allergies, current medications, past family history, past medical history, past social history, past surgical history and problem list.   Objective:   Vitals:   03/05/21 1307  BP: 113/81  Pulse: 72    Fetal Status:     Movement: Present     General:  Alert, oriented and cooperative. Patient is in no acute distress.  Respiratory: Normal respiratory effort, no problems with respiration noted  Mental Status: Normal mood and affect. Normal behavior. Normal judgment and thought content.  Rest of physical exam deferred due to type of  encounter  Imaging: No results found.  Assessment and Plan:  Pregnancy: B5D9741 at [redacted]w[redacted]d 1. Encounter for supervision of normal first pregnancy in third trimester No   2. Spina bifida in child of prior pregnancy, currently pregnant in third trimester Currently [place] is WNL   3. History of preterm premature rupture of membranes (PPROM)  Term labor symptoms and general obstetric precautions including but not limited to vaginal bleeding, contractions, leaking of fluid and fetal movement were reviewed in detail with the patient. I discussed the assessment and treatment plan with the patient. The patient was provided an opportunity to ask questions and all were answered. The patient agreed with the plan and demonstrated an understanding of the instructions. The patient was advised to call back or seek an in-person office evaluation/go to MAU at [facility] for any urgent or concerning symptoms. Please refer to After Visit Summary for other counseling recommendations.   I provided 15 minutes of face-to-face time during this encounter.  Return in about 1 week (around 03/12/2021).  Future Appointments  Date Time Provider Department Center  03/13/2021  8:35 AM Levie Heritage, DO [facility] None  03/17/2021 10:15 AM [facility]    Willodean Rosenthal, MD Center for [employer], [facility]

## 2021-03-07 ENCOUNTER — Other Ambulatory Visit: Payer: Self-pay

## 2021-03-07 ENCOUNTER — Encounter (HOSPITAL_COMMUNITY): Payer: Self-pay | Admitting: Obstetrics and Gynecology

## 2021-03-07 ENCOUNTER — Inpatient Hospital Stay (HOSPITAL_COMMUNITY)
Admission: AD | Admit: 2021-03-07 | Discharge: 2021-03-08 | DRG: 807 | Disposition: A | Discharge status: Home / Self Care | Payer: BC Managed Care – PPO | Attending: Obstetrics and Gynecology | Admitting: Obstetrics and Gynecology

## 2021-03-07 DIAGNOSIS — Z8616 Personal history of COVID-19: Secondary | ICD-10-CM | POA: Diagnosis not present

## 2021-03-07 DIAGNOSIS — Z3A39 39 weeks gestation of pregnancy: Secondary | ICD-10-CM | POA: Diagnosis not present

## 2021-03-07 DIAGNOSIS — O26893 Other specified pregnancy related conditions, third trimester: Secondary | ICD-10-CM | POA: Diagnosis not present

## 2021-03-07 DIAGNOSIS — Z3403 Encounter for supervision of normal first pregnancy, third trimester: Secondary | ICD-10-CM

## 2021-03-07 DIAGNOSIS — O09293 Supervision of pregnancy with other poor reproductive or obstetric history, third trimester: Secondary | ICD-10-CM

## 2021-03-07 DIAGNOSIS — Z2882 Immunization not carried out because of caregiver refusal: Secondary | ICD-10-CM | POA: Diagnosis not present

## 2021-03-07 LAB — COMPREHENSIVE METABOLIC PANEL
ALT: 15 U/L (ref 0–44)
AST: 22 U/L (ref 15–41)
Albumin: 3 g/dL — ABNORMAL LOW (ref 3.5–5.0)
Alkaline Phosphatase: 132 U/L — ABNORMAL HIGH (ref 38–126)
Anion gap: 10 (ref 5–15)
BUN: 6 mg/dL (ref 6–20)
CO2: 20 mmol/L — ABNORMAL LOW (ref 22–32)
Calcium: 8.8 mg/dL — ABNORMAL LOW (ref 8.9–10.3)
Chloride: 105 mmol/L (ref 98–111)
Creatinine, Ser: 0.62 mg/dL (ref 0.44–1.00)
GFR, Estimated: >60 mL/min (ref >60)
Glucose, Bld: 84 mg/dL (ref 70–99)
Potassium: 3.8 mmol/L (ref 3.5–5.1)
Sodium: 135 mmol/L (ref 135–145)
Total Bilirubin: 0.7 mg/dL (ref 0.3–1.2)
Total Protein: 6 g/dL — ABNORMAL LOW (ref 6.5–8.1)

## 2021-03-07 LAB — CBC
HCT: 37.2 % (ref 36.0–46.0)
Hemoglobin: 12.5 g/dL (ref 12.0–15.0)
MCH: 29.1 pg (ref 26.0–34.0)
MCHC: 33.6 g/dL (ref 30.0–36.0)
MCV: 86.7 fL (ref 80.0–100.0)
Platelets: 198 10*3/uL (ref 150–400)
RBC: 4.29 MIL/uL (ref 3.87–5.11)
RDW: 13.5 % (ref 11.5–15.5)
WBC: 13.3 10*3/uL — ABNORMAL HIGH (ref 4.0–10.5)
nRBC: 0 % (ref 0.0–0.2)

## 2021-03-07 LAB — PROTEIN / CREATININE RATIO, URINE
Creatinine, Urine: 48.22 mg/dL
Total Protein, Urine: <6 mg/dL

## 2021-03-07 LAB — RPR: RPR Ser Ql: NONREACTIVE

## 2021-03-07 LAB — TYPE AND SCREEN
ABO/RH(D): A POS
Antibody Screen: NEGATIVE

## 2021-03-07 MED ORDER — FENTANYL-BUPIVACAINE-NACL 0.5-0.125-0.9 MG/250ML-% EP SOLN
12.0000 mL/h | EPIDURAL | Status: DC | PRN
  Filled 2021-03-07: qty 250

## 2021-03-07 MED ORDER — TETANUS-DIPHTH-ACELL PERTUSSIS 5-2.5-18.5 LF-MCG/0.5 IM SUSY: 0.5000 mL | PREFILLED_SYRINGE | Freq: Once | INTRAMUSCULAR | Status: DC

## 2021-03-07 MED ORDER — OXYTOCIN BOLUS FROM INFUSION
333.0000 mL | Freq: Once | INTRAVENOUS | Status: AC
  Administered 2021-03-07: 333 mL via INTRAVENOUS

## 2021-03-07 MED ORDER — IBUPROFEN 600 MG PO TABS
600.0000 mg | ORAL_TABLET | Freq: Four times a day (QID) | ORAL | Status: DC
Start: 2021-03-07 — End: 2021-03-08
  Administered 2021-03-07 – 2021-03-08 (×5): 600 mg via ORAL
  Filled 2021-03-07 (×5): qty 1

## 2021-03-07 MED ORDER — SIMETHICONE 80 MG PO CHEW
80.0000 mg | CHEWABLE_TABLET | ORAL | Status: DC | PRN
Start: 2021-03-07 — End: 2021-03-08

## 2021-03-07 MED ORDER — OXYTOCIN-SODIUM CHLORIDE 30-0.9 UT/500ML-% IV SOLN
2.5000 [IU]/h | INTRAVENOUS | Status: DC
  Filled 2021-03-07: qty 500

## 2021-03-07 MED ORDER — LACTATED RINGERS IV SOLN: 500.0000 mL | Freq: Once | INTRAVENOUS | Status: DC

## 2021-03-07 MED ORDER — BENZOCAINE-MENTHOL 20-0.5 % EX AERO: 1.0000 "application " | INHALATION_SPRAY | CUTANEOUS | Status: DC | PRN

## 2021-03-07 MED ORDER — EPHEDRINE 5 MG/ML INJ: 10.0000 mg | INTRAVENOUS | Status: DC | PRN

## 2021-03-07 MED ORDER — FERROUS SULFATE 325 (65 FE) MG PO TABS
325.0000 mg | ORAL_TABLET | ORAL | Status: DC
Start: 2021-03-07 — End: 2021-03-08
  Administered 2021-03-07: 325 mg via ORAL
  Filled 2021-03-07: qty 1

## 2021-03-07 MED ORDER — DIBUCAINE (PERIANAL) 1 % EX OINT
1.0000 | TOPICAL_OINTMENT | CUTANEOUS | Status: DC | PRN
Start: 2021-03-07 — End: 2021-03-08

## 2021-03-07 MED ORDER — ONDANSETRON HCL 4 MG PO TABS: 4.0000 mg | ORAL_TABLET | ORAL | Status: DC | PRN

## 2021-03-07 MED ORDER — ACETAMINOPHEN 325 MG PO TABS: 650.0000 mg | ORAL_TABLET | ORAL | Status: DC | PRN

## 2021-03-07 MED ORDER — PHENYLEPHRINE 40 MCG/ML (10ML) SYRINGE FOR IV PUSH (FOR BLOOD PRESSURE SUPPORT): 80.0000 ug | PREFILLED_SYRINGE | INTRAVENOUS | Status: DC | PRN

## 2021-03-07 MED ORDER — MEASLES, MUMPS & RUBELLA VAC IJ SOLR: 0.5000 mL | Freq: Once | INTRAMUSCULAR | Status: DC

## 2021-03-07 MED ORDER — PRENATAL MULTIVITAMIN CH
1.0000 | ORAL_TABLET | Freq: Every day | ORAL | Status: DC
  Administered 2021-03-07 – 2021-03-08 (×2): 1 via ORAL
  Filled 2021-03-07 (×2): qty 1

## 2021-03-07 MED ORDER — LACTATED RINGERS IV SOLN: INTRAVENOUS | Status: DC

## 2021-03-07 MED ORDER — OXYCODONE-ACETAMINOPHEN 5-325 MG PO TABS: 1.0000 | ORAL_TABLET | ORAL | Status: DC | PRN

## 2021-03-07 MED ORDER — DIPHENHYDRAMINE HCL 25 MG PO CAPS: 25.0000 mg | ORAL_CAPSULE | Freq: Four times a day (QID) | ORAL | Status: DC | PRN

## 2021-03-07 MED ORDER — ONDANSETRON HCL 4 MG/2ML IJ SOLN: 4.0000 mg | INTRAMUSCULAR | Status: DC | PRN

## 2021-03-07 MED ORDER — DIPHENHYDRAMINE HCL 50 MG/ML IJ SOLN: 12.5000 mg | INTRAMUSCULAR | Status: DC | PRN

## 2021-03-07 MED ORDER — FENTANYL CITRATE (PF) 100 MCG/2ML IJ SOLN: 50.0000 ug | INTRAMUSCULAR | Status: DC | PRN

## 2021-03-07 MED ORDER — DOCUSATE SODIUM 100 MG PO CAPS
100.0000 mg | ORAL_CAPSULE | Freq: Two times a day (BID) | ORAL | Status: DC
Start: 2021-03-07 — End: 2021-03-08
  Administered 2021-03-07 – 2021-03-08 (×2): 100 mg via ORAL
  Filled 2021-03-07 (×2): qty 1

## 2021-03-07 MED ORDER — COCONUT OIL OIL: 1.0000 "application " | TOPICAL_OIL | Status: DC | PRN

## 2021-03-07 MED ORDER — MEDROXYPROGESTERONE ACETATE 150 MG/ML IM SUSP: 150.0000 mg | INTRAMUSCULAR | Status: DC | PRN

## 2021-03-07 MED ORDER — ONDANSETRON HCL 4 MG/2ML IJ SOLN
4.0000 mg | Freq: Four times a day (QID) | INTRAMUSCULAR | Status: DC | PRN
  Administered 2021-03-07: 4 mg via INTRAVENOUS
  Filled 2021-03-07: qty 2

## 2021-03-07 MED ORDER — WITCH HAZEL-GLYCERIN EX PADS: 1.0000 "application " | MEDICATED_PAD | CUTANEOUS | Status: DC | PRN

## 2021-03-07 MED ORDER — FLEET ENEMA 7-19 GM/118ML RE ENEM
1.0000 | ENEMA | Freq: Every day | RECTAL | Status: DC | PRN
Start: 2021-03-07 — End: 2021-03-08

## 2021-03-07 MED ORDER — METHYLERGONOVINE MALEATE 0.2 MG PO TABS: 0.2000 mg | ORAL_TABLET | ORAL | Status: DC | PRN

## 2021-03-07 MED ORDER — OXYCODONE-ACETAMINOPHEN 5-325 MG PO TABS: 2.0000 | ORAL_TABLET | ORAL | Status: DC | PRN

## 2021-03-07 MED ORDER — METHYLERGONOVINE MALEATE 0.2 MG/ML IJ SOLN: 0.2000 mg | INTRAMUSCULAR | Status: DC | PRN

## 2021-03-07 MED ORDER — BISACODYL 10 MG RE SUPP: 10.0000 mg | Freq: Every day | RECTAL | Status: DC | PRN

## 2021-03-07 MED ORDER — LIDOCAINE HCL (PF) 1 % IJ SOLN
30.0000 mL | INTRAMUSCULAR | Status: DC | PRN
Start: 2021-03-07 — End: 2021-03-07

## 2021-03-07 MED ORDER — SOD CITRATE-CITRIC ACID 500-334 MG/5ML PO SOLN: 30.0000 mL | ORAL | Status: DC | PRN

## 2021-03-07 MED ORDER — LACTATED RINGERS IV SOLN: 500.0000 mL | INTRAVENOUS | Status: DC | PRN

## 2021-03-07 NOTE — Lactation Note (Signed)
This note was copied from a baby's chart. Lactation Consultation Note  Patient Name: Debbie Harrison [id number] Date: 03/07/2021 Reason for consult: Initial assessment Age:33 Hours P2, mother reports that she pumped for 9 months for her first child that was in NICU. She never breastfed infant.  Infant has breastfed on and off since delivery per mother.  Mother attempting to breastfed when Lc arrived to room. Infant sleepy. Several attempts to latch infant. Mother hand expresses small amt of colostrum.  Encouraged frequent STS and cue base feeding. Mother denies having any concerns or questions.   Maternal Data Has patient been taught Hand Expression?: Yes Does the patient have breastfeeding experience prior to this delivery?: Yes How long did the patient breastfeed?: pumped for NICU baby for 9  months, infant never latched  Feeding Mother's Current Feeding Choice: Breast Milk  LATCH Score Latch: Too sleepy or reluctant, no latch achieved, no sucking elicited.  Audible Swallowing: None  Type of Nipple: Everted at rest and after stimulation  Comfort (Breast/Nipple): Soft / non-tender  Hold (Positioning): No assistance needed to correctly position infant at breast.  LATCH Score: 6   Lactation Tools Discussed/Used    Interventions Interventions: Breast feeding basics reviewed;Assisted with latch;Skin to skin;Hand express;Breast compression;Adjust position;Support pillows;Position options;LC Services brochure;Education  Discharge    Consult Status Consult Status: Follow-up Date: 03/08/21 Follow-up type: In-patient    Stevan Born [facility] 03/07/2021, 12:23 PM

## 2021-03-07 NOTE — Lactation Note (Addendum)
This note was copied from a baby's chart. Lactation Consultation Note  Patient Name: Debbie Harrison Date: 03/07/2021 Age:33 hours P2, term female infant. LC entered the room, mom was doing skin to skin with infant. Per dad, infant breastfeed from 1600 to 1630 pm.  Per mom, infant recently breastfeed, infant is now breastfeeding  for 10 minutes or longer, infant is now sustaining latch and breastfeeding for a longer duration than earlier today. Mom feels breastfeed is going well. Per mom, she doesn't have any questions or concerns for LC at this time.  Mom will continue to breastfeed infant according to feeding cues, 8 to 12+ or more times within 24 hours, skin to skin.  Maternal Data    Feeding    LATCH Score                    Lactation Tools Discussed/Used    Interventions    Discharge    Consult Status      Debbie Harrison 03/07/2021, 6:17 PM

## 2021-03-07 NOTE — Lactation Note (Signed)
This note was copied from a baby's chart. Lactation Consultation Note  Patient Name: Boy Maral Lampe [id number] Date: 03/07/2021 Reason for consult: L&D Initial assessment Age:33 hours  P2, Baby was latched when [facility] entered room and has been feeding for 30 min. Mother pumped with first child born at 19 weeks. Lactation to follow up on MBU.  Maternal Data Does the patient have breastfeeding experience prior to this delivery?:  (1st child 35 weeks, mother pumped)  Feeding Mother's Current Feeding Choice: Breast Milk  LATCH Score Latch: Repeated attempts needed to sustain latch, nipple held in mouth throughout feeding, stimulation needed to elicit sucking reflex.  Audible Swallowing: A few with stimulation  Type of Nipple: Everted at rest and after stimulation  Comfort (Breast/Nipple): Soft / non-tender  Hold (Positioning): No assistance needed to correctly position infant at breast.  LATCH Score: 8   Lactation Tools Discussed/Used    Interventions Interventions: Education  Discharge    Consult Status Consult Status: Follow-up from L&D    Dahlia Byes [facility] 03/07/2021, 7:02 AM

## 2021-03-07 NOTE — MAU Note (Signed)
..  Debbie Harrison is a 35 y.o. at [redacted]w[redacted]d here in MAU reporting: contractions since 9pm that are now 3-4. Denies vaginal bleeding or leaking of fluid. +FM  Pain score: 5/10

## 2021-03-07 NOTE — H&P (Signed)
OBSTETRIC ADMISSION HISTORY AND PHYSICAL  Debbie Harrison is a 35 y.o. female [phone] with IUP at [redacted]w[redacted]d by 8 week [place] presenting for contractions/labor. She reports +FMs, No LOF, no VB, no blurry vision, headaches or peripheral edema, and RUQ pain.  She plans on breast feeding. She request vasectomy for birth control. She received her prenatal care at  [facility]    Dating: By 8 week [place] --->  Estimated Date of Delivery: 03/14/21  Prenatal History/Complications:  --History of preterm delivery at 35 weeks --History of LEEP and cervical polyp, normal cervical length  --Child at home with spina bifida  --COVID in 12/2020, did not require hospitalization   Past Medical History: Past Medical History:  Diagnosis Date   Anemia    Vaginal Pap smear, abnormal 2020    Past Surgical History: Past Surgical History:  Procedure Laterality Date   breast lift     LEEP     wisdom teeth extraxction      Obstetrical History: OB History     Gravida  3   Para  1   Term      Preterm  1   AB  1   Living  1      SAB  1   IAB      Ectopic      Multiple  0   Live Births  1           Social History Social History   Socioeconomic History   Marital status: Married    Spouse name: Debbie Harrison   Number of children: Not on file   Years of education: Not on file   Highest education level: Not on file  Occupational History   Not on file  Tobacco Use   Smoking status: Never   Smokeless tobacco: Never  Vaping Use   Vaping Use: Never used  Substance and Sexual Activity   Alcohol use: Not Currently    Alcohol/week: 3.0 standard drinks    Types: 3 Glasses of wine per week   Drug use: Never   Sexual activity: Yes    Birth control/protection: None  Other Topics Concern   Not on file  Social History Narrative   Not on file   Social Determinants of Health   Financial Resource Strain: Not on file  Food Insecurity: Not on file  Transportation Needs: Not on file   Physical Activity: Not on file  Stress: Not on file  Social Connections: Not on file    Family History: Family History  Problem Relation Age of Onset   Hypertension Father     Allergies: No Known Allergies  Medications Prior to Admission  Medication Sig Dispense Refill Last Dose   Prenatal MV-Min-Fe Fum-FA-DHA (PRENATAL 1 PO) Take 1 tablet by mouth daily.    03/06/2021     Review of Systems   All systems reviewed and negative except as stated in HPI  Blood pressure 125/90, pulse 88, temperature 97.8 F (36.6 C), temperature source Oral, height 5\' 7"  (1.702 m), weight 73.3 kg, last menstrual period 05/31/2020, SpO2 100 %, unknown if currently breastfeeding. General appearance: alert, cooperative, and no distress Lungs: clear to auscultation bilaterally Heart: regular rate and rhythm Abdomen: soft, non-tender; bowel sounds normal Pelvic: 6/80/-1 Extremities: Homans sign is negative, no sign of DVT DTR's 2+ Presentation: cephalic Fetal monitoringBaseline: 145 bpm, Variability: Good {> 6 bpm), Accelerations: Reactive, and Decelerations: Absent Uterine activity2 minutes Dilation: 6 Effacement (%): 80 Exam by:: [email or web address],  RNC   Prenatal labs: ABO, Rh: A/Positive/-- (03/15 1058) Antibody: Negative (03/15 1058) Rubella: 1.41 (03/15 1058) RPR: Non Reactive (08/03 0842)  HBsAg: Negative (03/15 1058)  HIV: Non Reactive (08/03 0842)  GBS: Negative/-- (09/22 1445)  2 weeks of CBGS WNL.  Genetic screening normal, low risk NIPS and negative AFP screen Anatomy US normal   Prenatal Transfer Tool  Maternal Diabetes: No Genetic Screening: Normal Maternal Ultrasounds/Referrals: Normal Fetal Ultrasounds or other Referrals:  None Maternal Substance Abuse:  No Significant Maternal Medications:  None Significant Maternal Lab Results: Group B Strep negative  No results found for this or any previous visit (from the past 24 hour(s)).  Patient Active Problem List   Diagnosis  Date Noted   History of preterm premature rupture of membranes (PPROM) 09/04/2020   Encounter for supervision of normal first pregnancy in third trimester 08/06/2020   Spina bifida in child of prior pregnancy, currently pregnant 08/06/2020   Acne 01/17/2016   History of PCOS 01/17/2016   Hyperhidrosis 01/08/2015   Attention deficit disorder 12/08/2011    Assessment/Plan:  Debbie Harrison is a 35 y.o. [id number] at [redacted]w[redacted]d here for SOL.   #Labor: Will allow expectant management for now. Cont to monitor.  #Pain: PRN #FWB: Cat 1  #ID: GBS Negative  #MOF: Breast #MOC: Vasectomy  #Circ: Yes, inpatient   Allayne Stack, DO  03/07/2021, 3:35 AM

## 2021-03-07 NOTE — Discharge Summary (Signed)
Postpartum Discharge Summary     Patient Name: Debbie Harrison DOB: 12-Jun-1985 MRN: [id number]  Date of admission: 03/07/2021 Delivery date:03/07/2021  Delivering provider: Christin Fudge  Date of discharge: 03/08/2021  Admitting diagnosis: Indication for care in labor and delivery, antepartum [O75.9] Intrauterine pregnancy: [redacted]w[redacted]d    Secondary diagnosis:  Active Problems:   Indication for care in labor and delivery, antepartum  Additional problems: None    Discharge diagnosis: Term Pregnancy Delivered                                              Post partum procedures: None Augmentation: AROM Complications: None  Hospital course: Onset of Labor With Vaginal Delivery      35y.o. yo [id number] 33w0das admitted in Active Labor on 03/07/2021. Patient had an uncomplicated labor course as follows:  Membrane Rupture Time/Date: 5:48 AM ,03/07/2021   Delivery Method:Vaginal, Spontaneous  Episiotomy: None  Lacerations:  None  Patient had an uncomplicated postpartum course. She is ambulating, tolerating a regular diet, passing flatus, and urinating well. Patient is discharged home in stable condition on 03/08/21.  Newborn Data: Birth date:03/07/2021  Birth time:5:59 AM  Gender:Female  Living status:Living  Apgars:9 ,9  Weight:3260 g   Magnesium Sulfate received: No BMZ received: No Rhophylac:N/A MMR:N/A T-DaP: declines Flu: declined Transfusion:No  Physical exam  Vitals:   03/07/21 0848 03/07/21 1257 03/07/21 2053 03/08/21 0557  BP: 115/70 119/76 118/87 116/86  Pulse: 76 73 70 71  Resp: 17 16 18 18   Temp: 98.8 F (37.1 C) 98.7 F (37.1 C) 98.2 F (36.8 C) 97.8 F (36.6 C)  TempSrc: Oral Oral Oral Oral  SpO2: 98% 100%    Weight:      Height:       General: alert, cooperative, and no distress Lochia: appropriate Uterine Fundus: firm Incision: N/A DVT Evaluation: No evidence of DVT seen on physical exam. Labs: Lab Results  Component Value Date    WBC 13.3 (H) 03/07/2021   HGB 12.5 03/07/2021   HCT 37.2 03/07/2021   MCV 86.7 03/07/2021   PLT 198 03/07/2021   CMP Latest Ref Rng & Units 03/07/2021  Glucose 70 - 99 mg/dL 84  BUN 6 - 20 mg/dL 6  Creatinine 0.44 - 1.00 mg/dL 0.62  Sodium 135 - 145 mmol/L 135  Potassium 3.5 - 5.1 mmol/L 3.8  Chloride 98 - 111 mmol/L 105  CO2 22 - 32 mmol/L 20(L)  Calcium 8.9 - 10.3 mg/dL 8.8(L)  Total Protein 6.5 - 8.1 g/dL 6.0(L)  Total Bilirubin 0.3 - 1.2 mg/dL 0.7  Alkaline Phos 38 - 126 U/L 132(H)  AST 15 - 41 U/L 22  ALT 0 - 44 U/L 15   Edinburgh Score: Edinburgh Postnatal Depression Scale Screening Tool 03/07/2021  I have been able to laugh and see the funny side of things. 0  I have looked forward with enjoyment to things. 0  I have blamed myself unnecessarily when things went wrong. 1  I have been anxious or worried for no good reason. 1  I have felt scared or panicky for no good reason. 0  Things have been getting on top of me. 0  I have been so unhappy that I have had difficulty sleeping. 0  I have felt sad or miserable. 0  I have been so unhappy that I  have been crying. 0  The thought of harming myself has occurred to me. 0  Edinburgh Postnatal Depression Scale Total 2     After visit meds:  Allergies as of 03/08/2021   No Known Allergies      Medication List     TAKE these medications    acetaminophen 325 MG tablet Commonly known as: Tylenol Take 2 tablets (650 mg total) by mouth every 4 (four) hours as needed (for pain scale < 4).   ibuprofen 600 MG tablet Commonly known as: ADVIL Take 1 tablet (600 mg total) by mouth every 6 (six) hours.   PRENATAL 1 PO Take 1 tablet by mouth daily.         Discharge home in stable condition Infant Feeding: Breast Infant Disposition:home with mother Discharge instruction: per After Visit Summary and Postpartum booklet. Activity: Advance as tolerated. Pelvic rest for 6 weeks.  Diet: routine diet Future  Appointments: Future Appointments  Date Time Provider Martin  04/04/2021 11:15 AM Truett Mainland, DO [facility] None    Please schedule this patient for a Virtual postpartum visit in 4 weeks with the following provider: Any provider. Additional Postpartum F/U: None Low risk pregnancy complicated by:  Delivery mode:  Vaginal, Spontaneous  Anticipated Birth Control:   vasectomy   03/08/2021 Wells Guiles, DO  GME ATTESTATION:  I saw and evaluated the patient. I agree with the findings and the plan of care as documented in the resident's note and have made necessary edits.   Renard Matter, MD, MPH OB Fellow, [place] [facility] [place] 03/08/2021 11:28 AM

## 2021-03-07 NOTE — Plan of Care (Signed)

## 2021-03-08 ENCOUNTER — Encounter (HOSPITAL_COMMUNITY): Payer: Self-pay | Admitting: Obstetrics and Gynecology

## 2021-03-08 MED ORDER — IBUPROFEN 600 MG PO TABS: 600.0000 mg | ORAL_TABLET | Freq: Four times a day (QID) | ORAL | 0 refills | Status: AC

## 2021-03-08 MED ORDER — ACETAMINOPHEN 325 MG PO TABS: 650.0000 mg | ORAL_TABLET | ORAL | 0 refills | Status: AC | PRN

## 2021-03-08 NOTE — Progress Notes (Addendum)
POSTPARTUM PROGRESS NOTE  Subjective: Debbie Harrison is a 35 y.o. [id number] s/p SVD at [redacted]w[redacted]d.  She reports she is doing well. No acute events overnight. She denies any problems with ambulating, voiding or po intake. Denies nausea or vomiting. She has passed flatus. She has not had bowel movement. Pain is well controlled.  Lochia is Minimal.   Objective: BP 116/86 (BP Location: Right Arm)   Pulse 71   Temp 97.8 F (36.6 C) (Oral)   Resp 18   Ht 5\' 7"  (1.702 m)   Wt 73.3 kg   LMP 05/31/2020 (Exact Date)   SpO2 100%   Breastfeeding Unknown   BMI 25.31 kg/m   Physical Exam:  General: alert, cooperative and no distress Chest: CTAB, no respiratory distress Abdomen: soft, non-tender  Uterine Fundus: firm, non-tender Extremities: No calf swelling, tenderness, or edema  Recent Labs    03/07/21 0333  HGB 12.5  HCT 37.2    Assessment/Plan: DERYL PORTS is a 35 y.o. 32 s/p SVD at [redacted]w[redacted]d.   LOS: 1 day   Routine Postpartum Care: Doing well, pain well-controlled.  -- Continue routine care, lactation support  -- Contraception: vasectomy -- Feeding: breast -- Circumcision: yes (consented)  Dispo: Plan for discharge today  [redacted]w[redacted]d, DO 03/08/2021, 7:49 AM PGY-1, [facility]  GME ATTESTATION:  I saw and evaluated the patient. I agree with the findings and the plan of care as documented in the resident's note. See discharge summary for details of dispo  03/10/2021, MD, MPH OB Fellow, Faculty Practice [facility], Center for [facility] 03/08/2021 11:35 AM

## 2021-03-08 NOTE — Lactation Note (Signed)
This note was copied from a baby's chart. Lactation Consultation Note  Patient Name: Debbie Harrison [id number] Date: 03/08/2021 Reason for consult: Follow-up assessment;1st time breastfeeding;Term Age:33 hours   P2 mother whose infant is now 73 hours old.  This is a term baby at 39+0 weeks.  Mother attempted to breast feed her first child (now 48 years old) but was unsuccessful.  That baby was born at 35 weeks and in the NICU.  Baby "Beau" was asleep on mother's chest when I arrived.  He has recently returned from a circumcision.  Mother was able to breast feed prior to the circumcision and has been unsuccessful since due to sleepiness.  Reassurance given and discussed the possibility of sleepiness most of the day.  Informed mother that he may want to awaken and feed often tonight.  Mother verbalized understanding.  Encouraged continued hand expression.  Mother has a manual pump at bedside and has been able to express colostrum which she will feed back to "Beau."  Encouraged to call her RN/LC for latch assistance as needed.  Mother has a DEBP for home use.  Father present.   Maternal Data Has patient been taught Hand Expression?: Yes Does the patient have breastfeeding experience prior to this delivery?: No (Mother attempted to breast feed her 25 week old NICU baby)  Feeding Mother's Current Feeding Choice: Breast Milk  LATCH Score                    Lactation Tools Discussed/Used    Interventions Interventions: Education  Discharge Pump: Personal WIC Program: No  Consult Status Consult Status: Follow-up Date: 03/09/21 Follow-up type: In-patient    Heidee Audi R Randal Yepiz 03/08/2021, 12:40 PM

## 2021-03-13 ENCOUNTER — Encounter: Payer: BC Managed Care – PPO | Admitting: Family Medicine

## 2021-03-17 ENCOUNTER — Other Ambulatory Visit: Payer: BC Managed Care – PPO

## 2021-03-20 ENCOUNTER — Telehealth (HOSPITAL_COMMUNITY): Payer: Self-pay

## 2021-03-20 NOTE — Telephone Encounter (Signed)
No answer. Left message to return nurse call.  Marcelino Duster [facility] 03/20/2021,1410

## 2021-04-04 ENCOUNTER — Other Ambulatory Visit: Payer: Self-pay

## 2021-04-04 ENCOUNTER — Telehealth: Payer: BC Managed Care – PPO | Admitting: Family Medicine

## 2021-04-04 NOTE — Progress Notes (Signed)
    Post Partum Visit Note  Debbie Harrison is a 35 y.o. E0F1219 female who presents for a postpartum visit. She is 4 weeks postpartum following a normal spontaneous vaginal delivery.  I have fully reviewed the prenatal and intrapartum course. The delivery was at 39 gestational weeks.  Anesthesia: none. Postpartum course has been uneventful. Baby is doing well. Baby is feeding by breast. Bleeding no bleeding. Bowel function is normal. Bladder function is normal. Patient is not sexually active. Contraception method is vasectomy. Postpartum depression screening: negative.   Edinburgh Postnatal Depression Scale - 04/04/21 1055       Edinburgh Postnatal Depression Scale:  In the Past 7 Days   I have been able to laugh and see the funny side of things. 0    I have looked forward with enjoyment to things. 0    I have blamed myself unnecessarily when things went wrong. 0    I have been anxious or worried for no good reason. 0    I have felt scared or panicky for no good reason. 0    Things have been getting on top of me. 0    I have been so unhappy that I have had difficulty sleeping. 0    I have felt sad or miserable. 0    I have been so unhappy that I have been crying. 0    The thought of harming myself has occurred to me. 0    Edinburgh Postnatal Depression Scale Total 0             Armandina Stammer, RN Center for [employer], [facility]

## 2021-04-04 NOTE — Progress Notes (Signed)
Patient was unable to connect after talking with the nurse. Will try to reach out and get patient rescheduled.  Levie Heritage, DO

## 2022-05-01 ENCOUNTER — Ambulatory Visit: Payer: BC Managed Care – PPO | Admitting: Obstetrics and Gynecology

## 2022-05-01 ENCOUNTER — Encounter: Payer: Self-pay | Admitting: Obstetrics and Gynecology

## 2022-05-01 VITALS — BP 121/85 | HR 81 | Ht 67.0 in | Wt 139.0 lb

## 2022-05-01 DIAGNOSIS — Z3009 Encounter for other general counseling and advice on contraception: Secondary | ICD-10-CM

## 2022-05-01 NOTE — Progress Notes (Signed)
    GYNECOLOGY VISIT  Patient name: Debbie Harrison MRN [id number]  Date of birth: June 25, 1985 Chief Complaint:   Contraception   History:  LEIGHTON LUSTER is a 36 y.o. [id number] being seen today for interested in cu-IUD. Hormonal birth control made her feel "awful". IUD is for contraception. Menses for the first time since 2019 due to 3 pregnancies back to back. Her menses are not typically heavy or painful but they are irregular unless she is on birth control. Currently using hormonal birth control.    Past Medical History:  Diagnosis Date   Anemia    Vaginal Pap smear, abnormal 2020    Past Surgical History:  Procedure Laterality Date   breast lift     LEEP     wisdom teeth extraxction      The following portions of the patient's history were reviewed and updated as appropriate: allergies, current medications, past family history, past medical history, past social history, past surgical history and problem list.   Health Maintenance:   Last pap 07/2020. Results were: NILM w/ HRHPV negative. H/O abnormal pap: yes with HPV Last mammogram: n/a.   Review of Systems:  Pertinent items are noted in HPI. Comprehensive review of systems was otherwise negative.   Objective:  Physical Exam BP 121/85   Pulse 81   Ht 5\' 7"  (1.702 m)   Wt 139 lb (63 kg)   LMP 05/01/2022 (Exact Date)   Breastfeeding No   BMI 21.77 kg/m    Physical Exam Vitals and nursing note reviewed.  Constitutional:      Appearance: Normal appearance.  HENT:     Head: Normocephalic and atraumatic.  Pulmonary:     Effort: Pulmonary effort is normal.  Skin:    General: Skin is warm and dry.  Neurological:     General: No focal deficit present.     Mental Status: She is alert.  Psychiatric:        Mood and Affect: Mood normal.        Behavior: Behavior normal.        Thought Content: Thought content normal.        Judgment: Judgment normal.     Labs and Imaging No results found.     Assessment &  Plan:   1. Counseling for birth control regarding intrauterine device (IUD) Reviewed all forms of birth control options and concerns regarding various methods. She is sure she would like to proceed with IUD. Cu-IUD not available in office today, will return on separate date when Cu-IUD in office. Pap UTD. Will continue using pills in the interim. Discussed cu-IUD can increase cramping and menstrual flow and not intended to regulate cycles therefore menses could still be irregular with it in place. Hormonal IUD can manage menstrual irregularities and prevent pregnancy as well.    Routine preventative health maintenance measures emphasized.  14/12/2021, MD Minimally Invasive Gynecologic Surgery Center for [facility], [facility]

## 2022-06-01 ENCOUNTER — Ambulatory Visit: Payer: BC Managed Care – PPO | Admitting: Obstetrics and Gynecology

## 2022-07-22 ENCOUNTER — Ambulatory Visit (INDEPENDENT_AMBULATORY_CARE_PROVIDER_SITE_OTHER): Payer: BC Managed Care – PPO | Admitting: Obstetrics and Gynecology

## 2022-07-22 ENCOUNTER — Encounter: Payer: Self-pay | Admitting: Obstetrics and Gynecology

## 2022-07-22 VITALS — BP 122/82 | HR 101 | Wt 149.0 lb

## 2022-07-22 DIAGNOSIS — E282 Polycystic ovarian syndrome: Secondary | ICD-10-CM

## 2022-07-22 MED ORDER — DROSPIRENONE-ETHINYL ESTRADIOL 3-0.02 MG PO TABS: 1.0000 | ORAL_TABLET | Freq: Every day | ORAL | 3 refills | Status: AC

## 2022-07-22 NOTE — Progress Notes (Signed)
GYNECOLOGY VISIT  Patient name: Debbie Harrison MRN [id number]  Date of birth: May 18, 1986 Chief Complaint:   Menstrual Problem  History:  Debbie Harrison is a 37 y.o. [id number] being seen today for contraception management. Previously had opted for Cu-IUD but has had continued irregular bleeding and aware that Cu-IUD can worsen that, and would prefer bleeding control. Also reports having increased skin outbreaks and feels that PCOS symptoms are returning. Reports hx of PCOS diagnosed previously. Used pills when much younger.   ?Age ?37 years and smoking ?15 cigarettes per day No ?Two or more risk factors for arterial cardiovascular disease (such as older age, smoking, diabetes, and hypertension) No ?Hypertension (systolic ?[id number] mmHg or diastolic ?90 mmHg for the CDC and systolic ?0000000 mmHg or diastolic ?[id number] mmHg for the WHO) No ?Venous thromboembolism - Women with a history of thromboembolism not receiving anticoagulation or women with an acute embolic event No ?Known thrombogenic mutations No ?Known ischemic heart disease No ?History of stroke No ?Complicated valvular heart disease (pulmonary hypertension, risk for atrial fibrillation, history of subacute bacterial endocarditis) No ?Breast cancer No ?Cirrhosis No ?Migraine with aura No ?Hepatocellular adenoma or malignant hepatoma No   Past Medical History:  Diagnosis Date   Anemia    Vaginal Pap smear, abnormal 2020    Past Surgical History:  Procedure Laterality Date   breast lift     LEEP     wisdom teeth extraxction      The following portions of the patient's history were reviewed and updated as appropriate: allergies, current medications, past family history, past medical history, past social history, past surgical history and problem list.    Review of Systems:  Pertinent items are noted in HPI. Comprehensive review of systems was otherwise negative.   Objective:  Physical Exam BP 122/82   Pulse (!) 101   Wt 149  lb (67.6 kg)   Breastfeeding No   BMI 23.34 kg/m    Physical Exam Vitals and nursing note reviewed.  Constitutional:      Appearance: Normal appearance.  HENT:     Head: Normocephalic and atraumatic.  Pulmonary:     Effort: Pulmonary effort is normal.  Skin:    General: Skin is warm and dry.  Neurological:     General: No focal deficit present.     Mental Status: She is alert.  Psychiatric:        Mood and Affect: Mood normal.        Behavior: Behavior normal.        Thought Content: Thought content normal.        Judgment: Judgment normal.       Assessment & Plan:   1. PCOS (polycystic ovarian syndrome) The use of the oral contraceptive has been fully discussed with the patient. This includes the proper method to initiate (i.e. Sunday start after next normal menstrual onset versus same day start) and continue the pills, the need for regular compliance to ensure adequate contraceptive effect, the physiology which make the pill effective, the instructions for what to do in event of a missed pill, and warnings about anticipated minor side effects such as breakthrough spotting, nausea, breast tenderness, weight changes, acne, headaches, etc.  They have been told of the more serious potential side effects such as MI, stroke, and deep vein thrombosis, all of which are very unlikely.  They have been asked to report any signs of such serious problems immediately.  They should back up the pill  with a condom during any cycle in which antibiotics are prescribed, and during the first cycle as well. The need for additional protection, such as a condom, to prevent exposure to sexually transmitted diseases has also been discussed- the patient has been clearly reminded that OCP's cannot protect them against diseases such as HIV and others. They understand and wish to take the medication as prescribed. They was given a prescription for yaz and will return in 3 months for BP and contraceptive check.    - drospirenone-ethinyl estradiol (YAZ) 3-0.02 MG tablet; Take 1 tablet by mouth daily.  Dispense: 90 tablet; Refill: 3   Routine preventative health maintenance measures emphasized.  Darliss Cheney, MD Minimally Invasive Gynecologic Surgery Center for [place]

## 2022-07-22 NOTE — Progress Notes (Signed)
Patient would like to start with pills. Patient states that she has had some form of bleeding since Dec 2023. Kathrene Alu RN

## 2022-08-21 IMAGING — US US OB < 14 WEEKS - US OB TV
1 series · 15 of 28 positions shown · non-contrast
Comparison: None this gestation.

CLINICAL DATA: 34-year-old with a positive urine pregnancy test who
presents with vaginal bleeding and pelvic cramping that began last
night. The patient has not had a menstrual period since she
delivered her last child on 01/23/2019, meaning that her last period
was on 05/12/2018. Quantitative beta hCG is pending.

EXAM:
OBSTETRIC <14 WK US AND TRANSVAGINAL OB US
TECHNIQUE: Both transabdominal and transvaginal ultrasound examinations were
performed for complete evaluation of the gestation as well as the
maternal uterus, adnexal regions, and pelvic cul-de-sac.
Transvaginal technique was performed to assess early pregnancy.

[Series 1: us ob < 14 weeks - us ob tv · 15 of 36 slices shown]
[im 1/36]
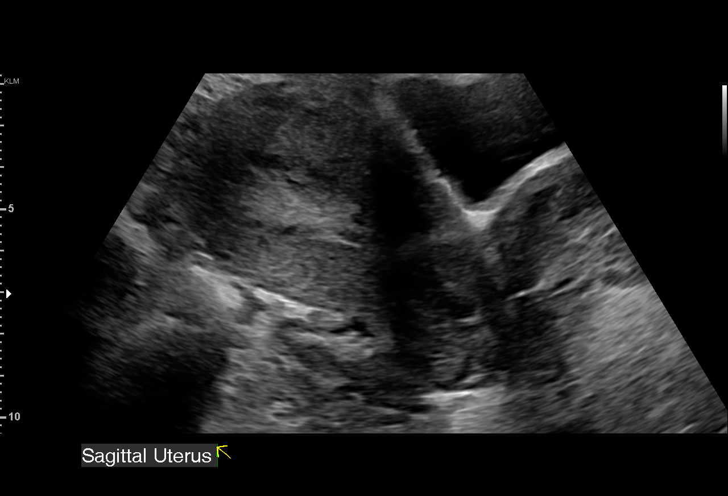
[im 3/36]
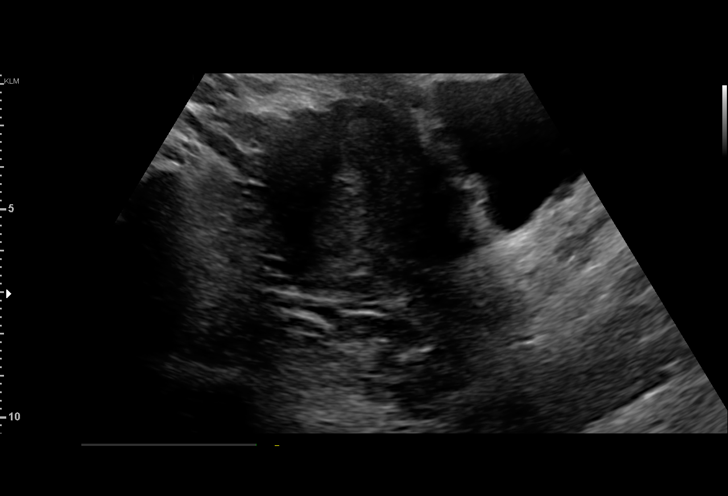
[im 6/36]
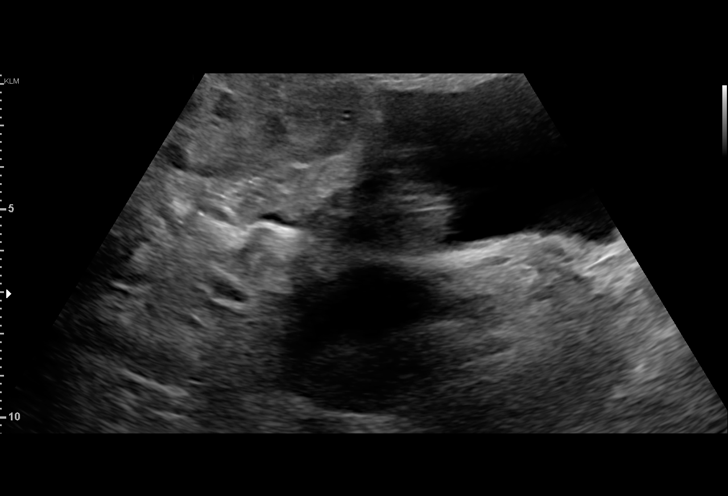
[im 8/36]
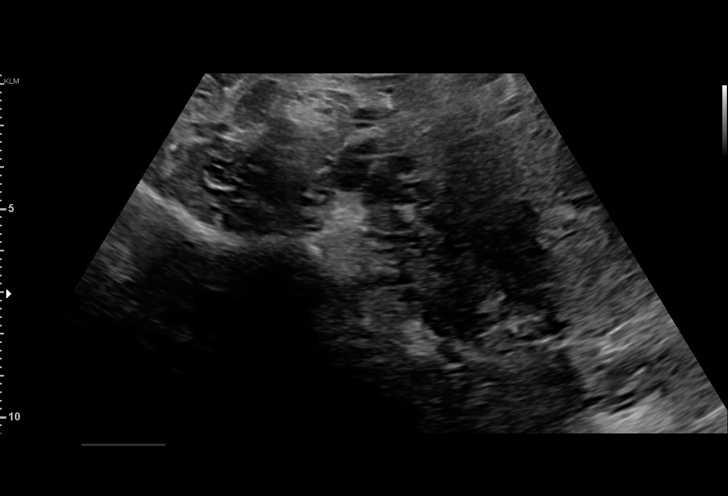
[im 11/36]
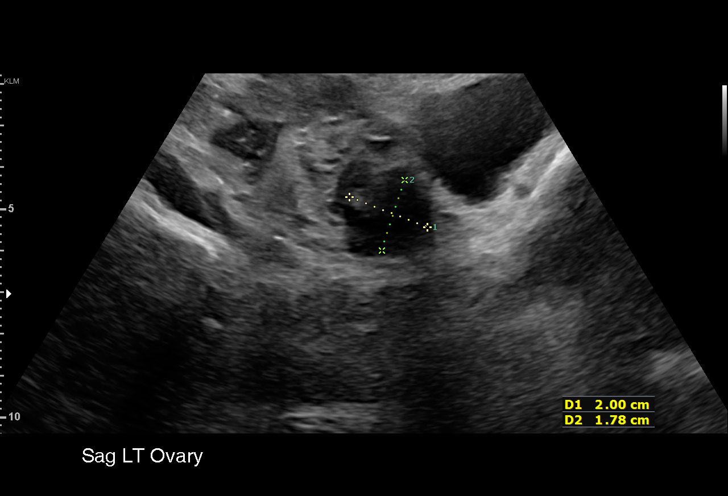
[im 13/36]
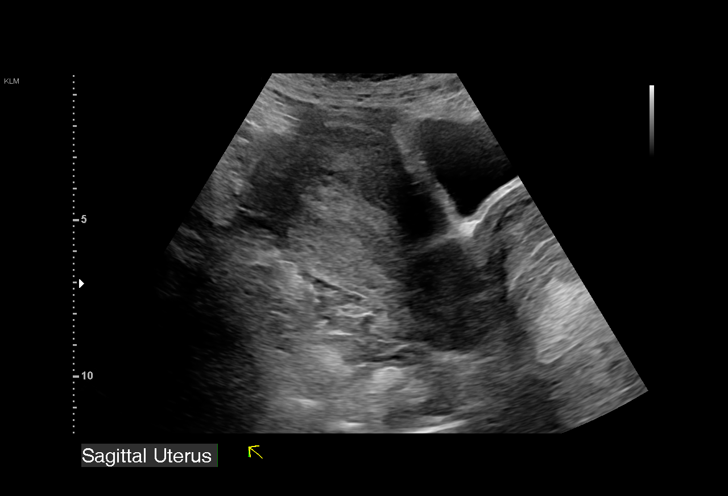
[im 16/36]
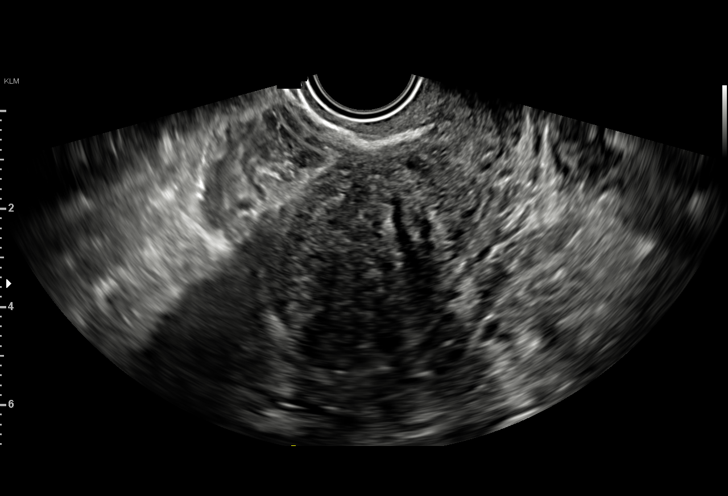
[im 19/36]
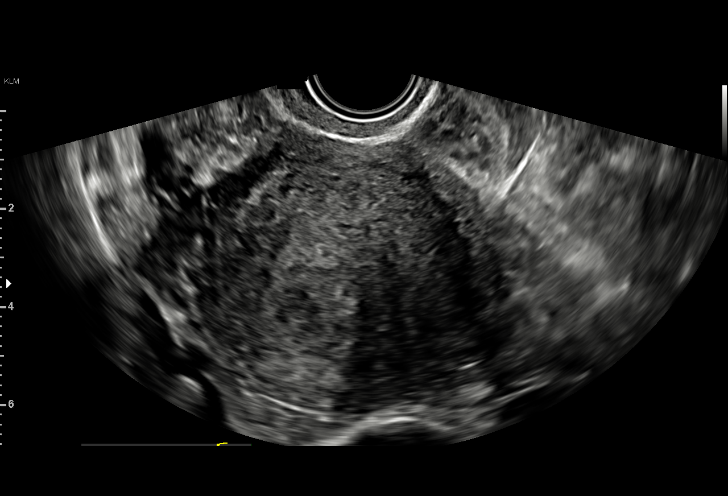
[im 20/36]
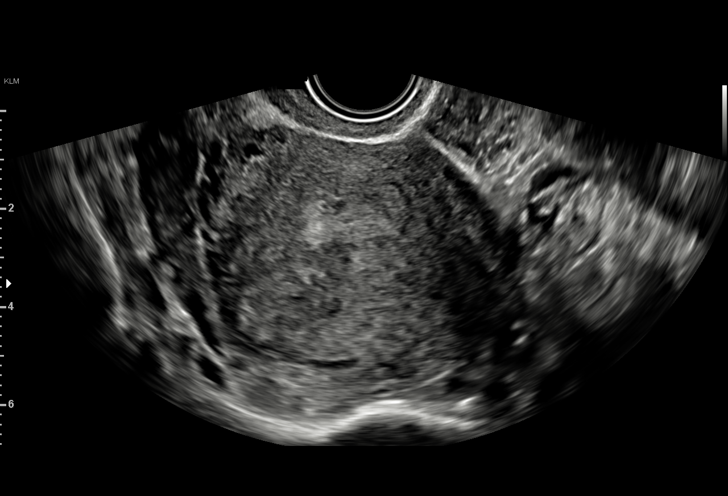
[im 23/36]
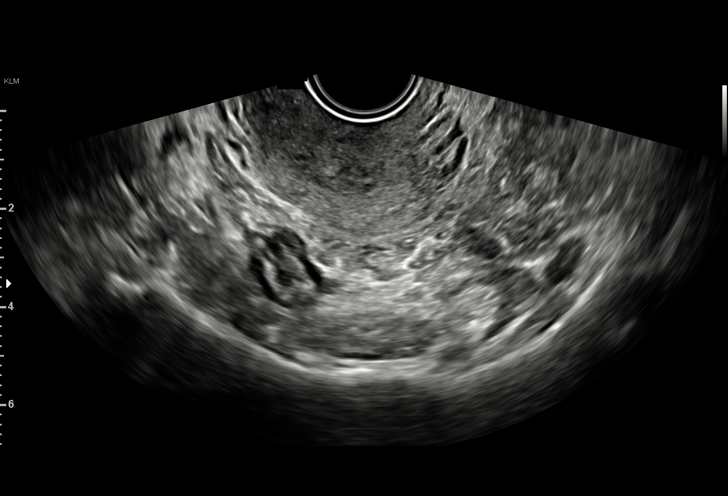
[im 25/36]
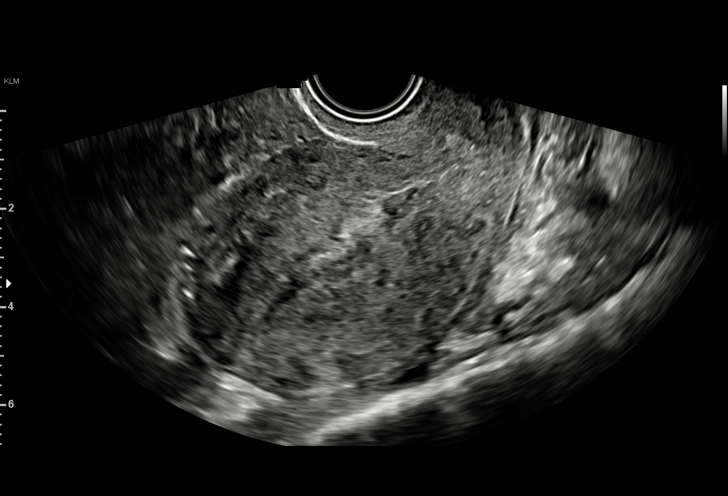
[im 28/36]
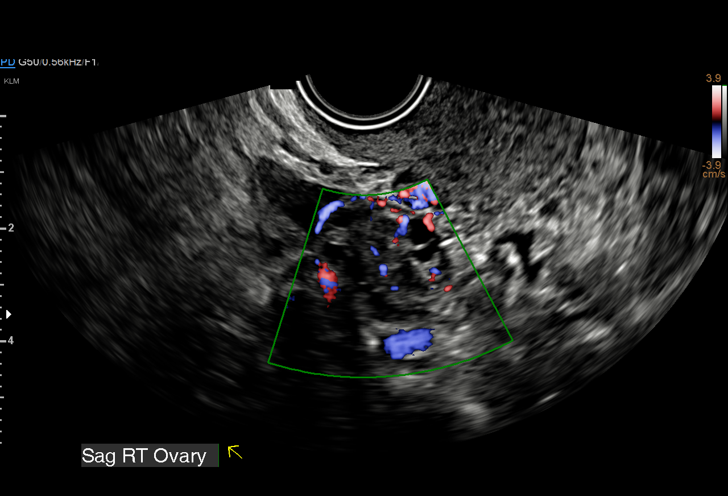
[im 30/36]
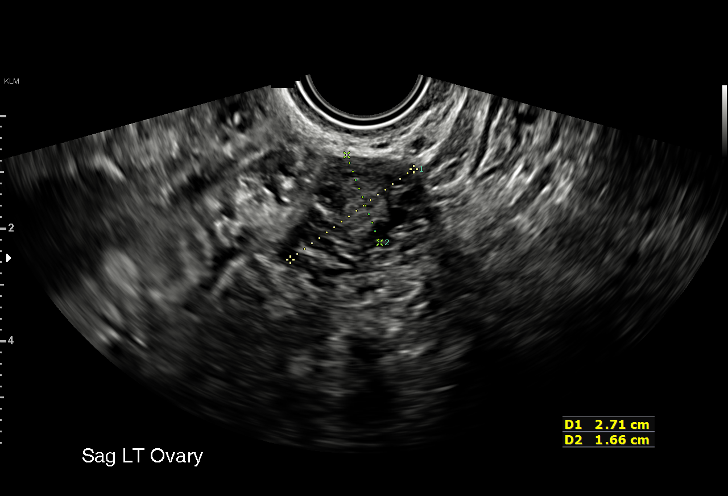
[im 33/36]
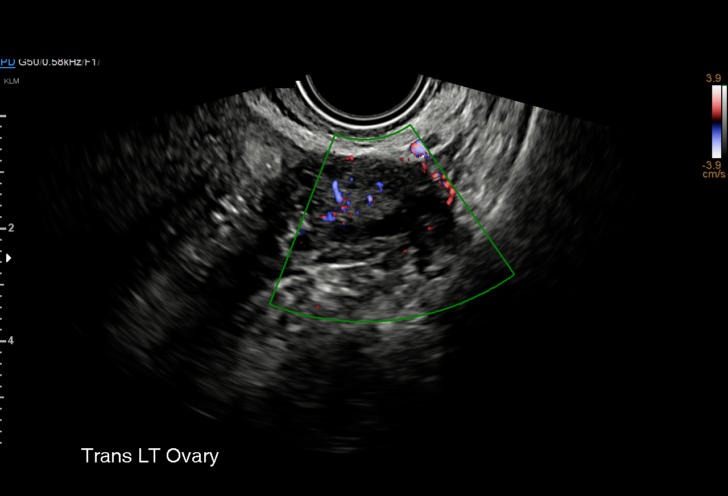
[im 36/36]
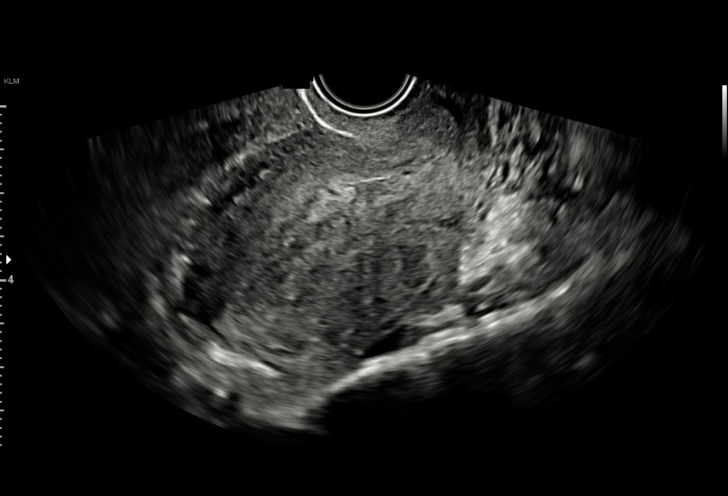

[15 of 28 positions shown; findings below may reference images not displayed]

FINDINGS: Intrauterine gestational sac: Not visualized.

Yolk sac:  Not visualized.

Embryo:  Not visualized.

Cardiac Activity: Not visualized.

MSD: Not applicable.

CRL: Not applicable.

US EDC: Not applicable.

Maternal uterus/adnexae: Endometrial thickening up to approximately
2.0 cm with minimal fluid in the endometrial canal. No myometrial
abnormality.

Normal appearing LEFT ovary measuring approximately 2.7 x 1.7 x
cm, containing small follicular cysts and demonstrating normal color
Doppler flow.

Normal appearing RIGHT ovary measuring approximately 2.7 x 1.6 x
cm, containing small follicular cysts and demonstrating normal color
Doppler flow.

No adnexal masses or free pelvic fluid.
IMPRESSION: 1. No visible intrauterine gestational sac. Please correlate with
quantitative beta hCG when these results become available.
2. Endometrial thickening up to approximately 2.0 cm.
3. Normal appearing ovaries.
4. No adnexal masses or free pelvic fluid.

## 2022-09-25 ENCOUNTER — Ambulatory Visit: Payer: BC Managed Care – PPO | Admitting: Obstetrics and Gynecology

## 2022-10-12 ENCOUNTER — Ambulatory Visit: Payer: BC Managed Care – PPO | Admitting: Obstetrics and Gynecology

## 2023-03-18 IMAGING — US US OB TRANSVAGINAL
1 series · 15 of 28 positions shown · non-contrast
Comparison: 01/24/2020

CLINICAL DATA: Questionable cervical mass on pelvic exam. First
trimester pregnancy.

EXAM:
TRANSVAGINAL OB ULTRASOUND
TECHNIQUE: Transvaginal ultrasound was performed for complete evaluation of the
gestation as well as the maternal uterus, adnexal regions, and
pelvic cul-de-sac.

[Series 1: us ob transvaginal · 32 acquisitions, 15 frames shown]
[im 1/32]
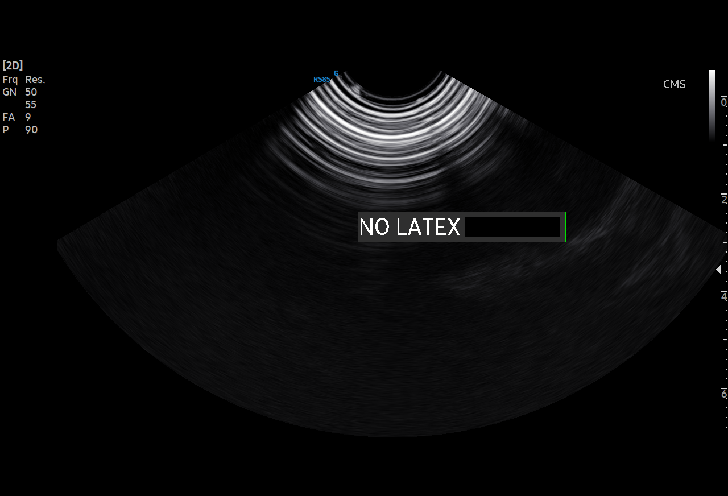
[im 3/32]
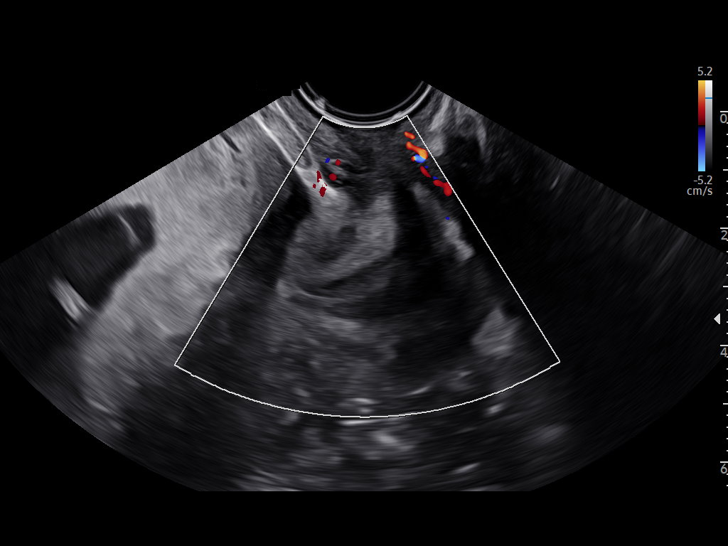
[im 5/32]
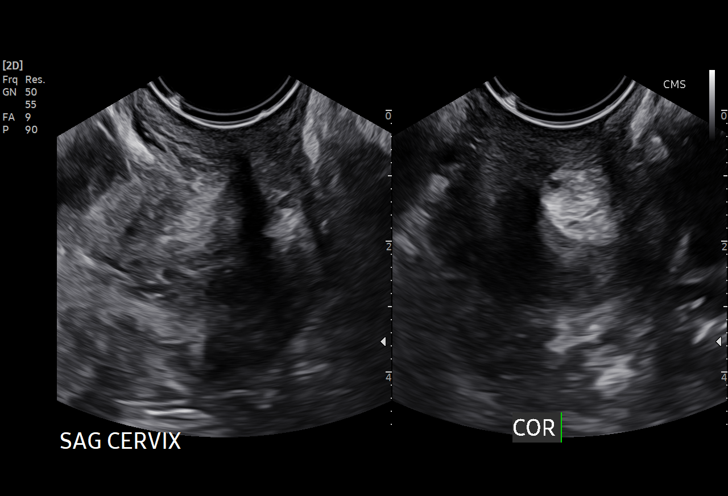
[im 7/32]
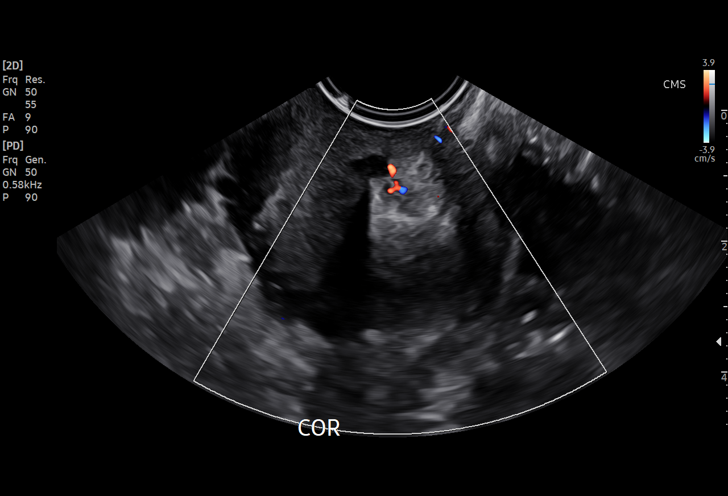
[im 10/32]
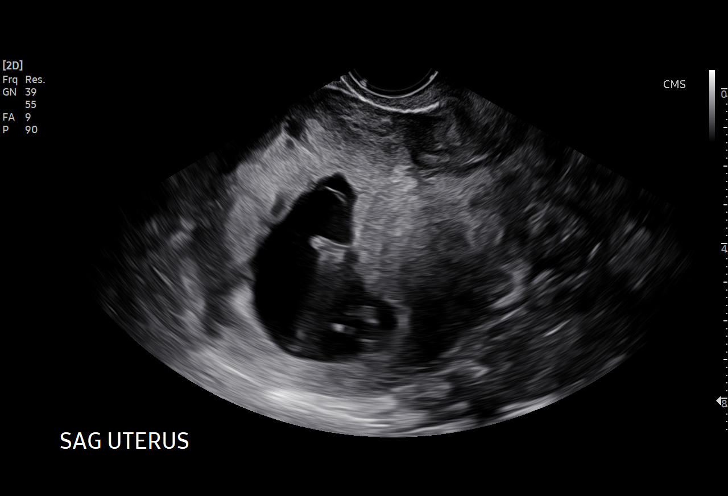
[im 12/32]
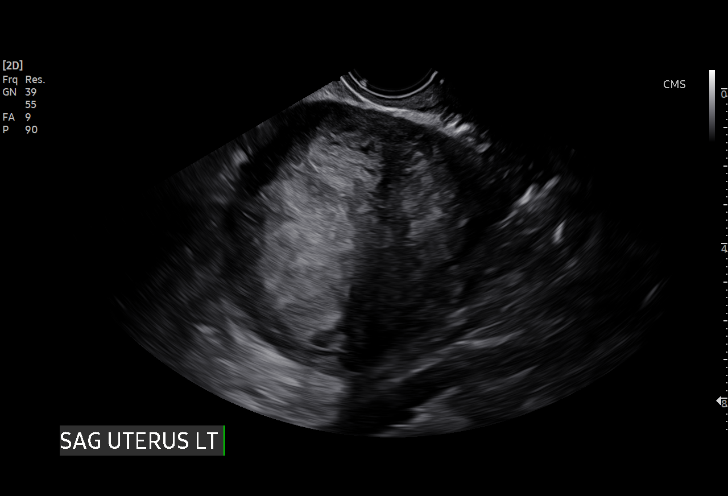
[im 14/32]
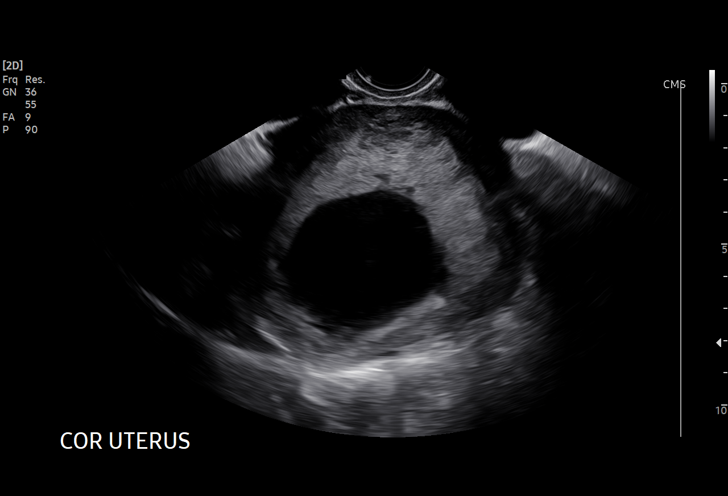
[im 17/32]
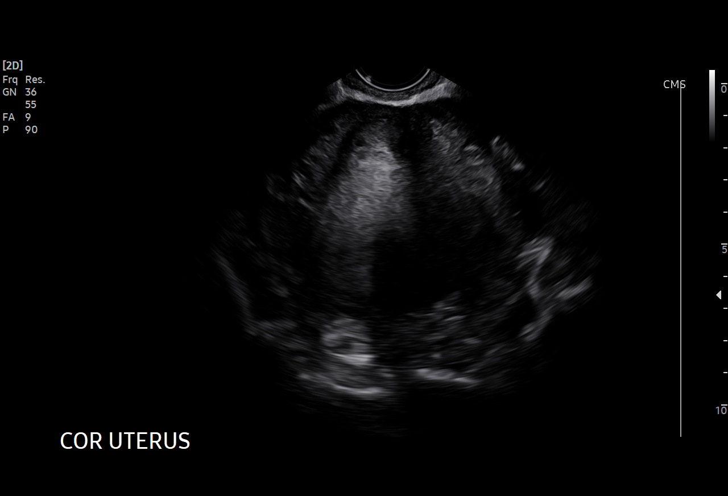
[im 18/32]
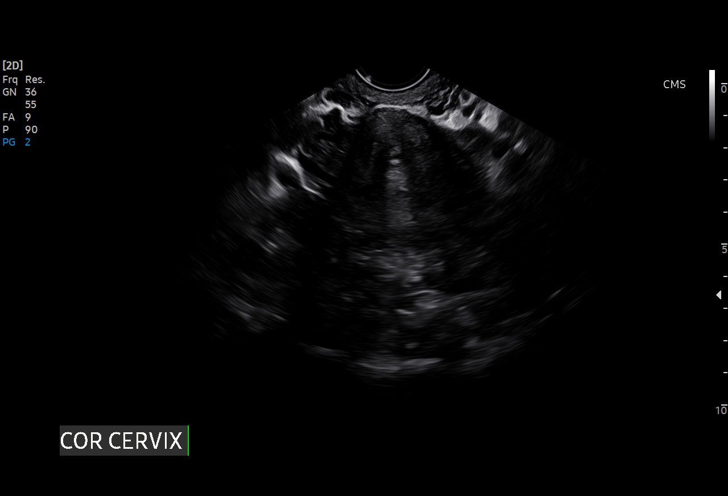
[im 20/32]
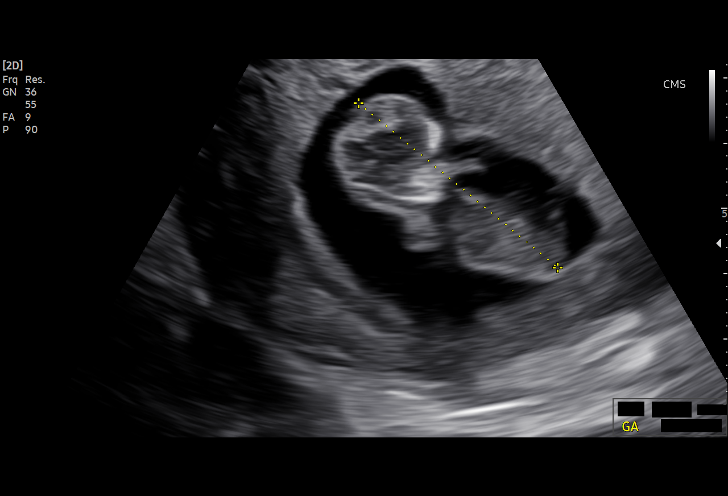
[im 22/32]
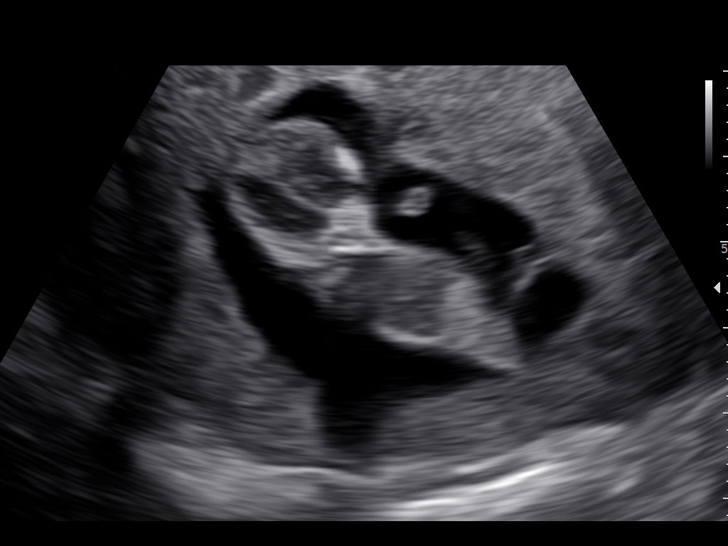
[im 25/32]
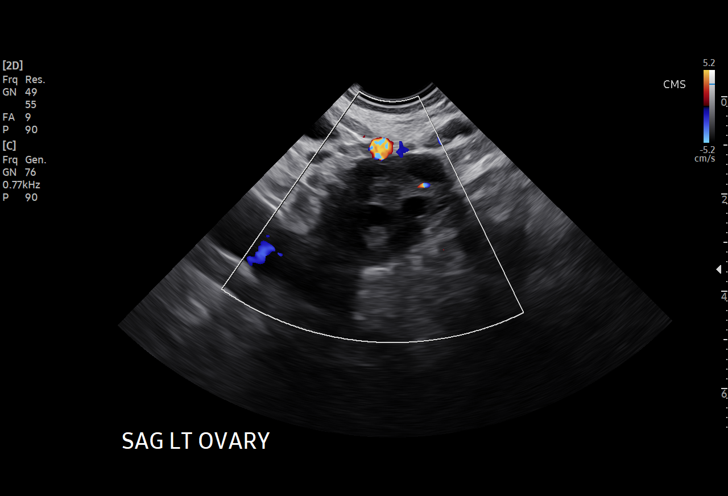
[im 27/32]
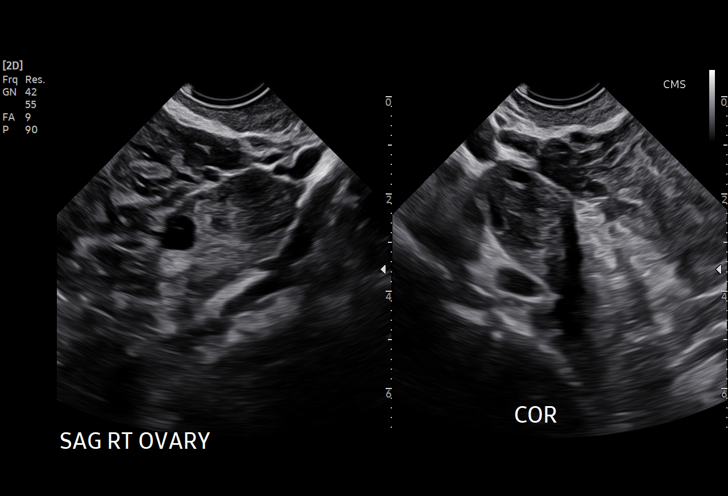
[im 29/32]
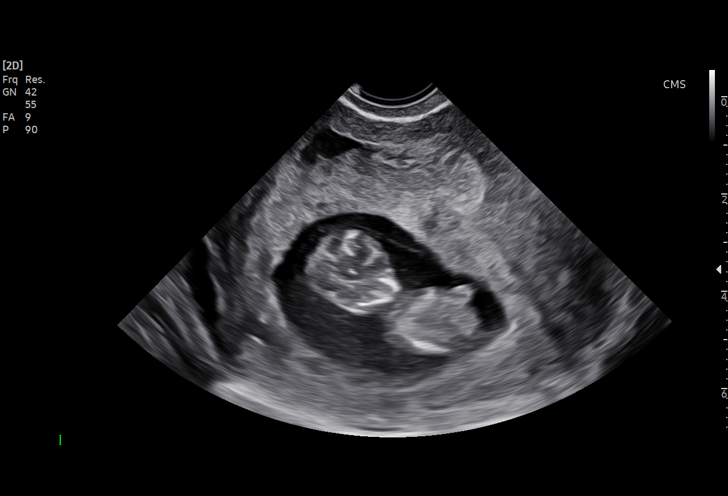
[im 32/32]
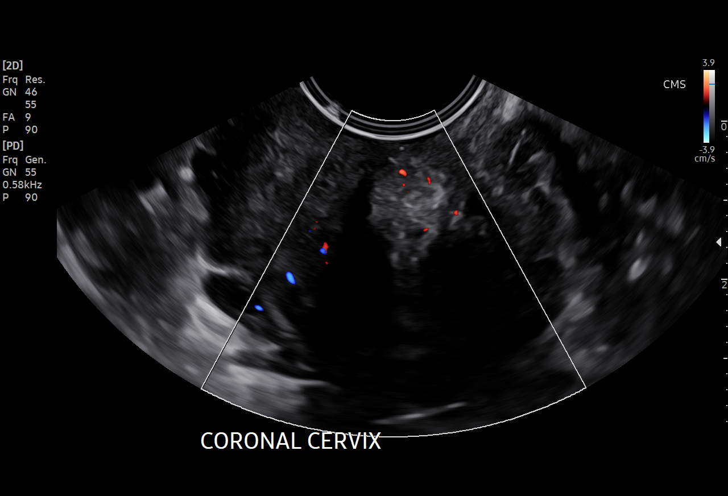

[15 of 28 positions shown; findings below may reference images not displayed]

FINDINGS: Intrauterine gestational sac: Single

Yolk sac:  Present

Embryo:  Present

Cardiac Activity: Present

Heart Rate: 171 bpm

CRL:   39.5 mm   10 w 6 d                  US EDC: 03/12/2021

Subchorionic hemorrhage:  Absent

Maternal uterus/adnexae: Polypoid lesion with mild internal blood
flow visualized at the external os of the cervix measuring 1.1 by
1.1 by 0.7 cm.
IMPRESSION: 1. Single intrauterine pregnancy with cardiac activity 171 beats per
minute and gestational age by today's ultrasound of 10 weeks 6 days.
2. 1.1 cm polypoid lesion with internal blood flow visualized at the
external os of the cervix.

## 2024-04-13 DIAGNOSIS — F900 Attention-deficit hyperactivity disorder, predominantly inattentive type: Secondary | ICD-10-CM | POA: Diagnosis not present

## 2024-04-13 DIAGNOSIS — F411 Generalized anxiety disorder: Secondary | ICD-10-CM | POA: Diagnosis not present

## 2024-04-13 DIAGNOSIS — F331 Major depressive disorder, recurrent, moderate: Secondary | ICD-10-CM | POA: Diagnosis not present

## 2024-04-25 DIAGNOSIS — F339 Major depressive disorder, recurrent, unspecified: Secondary | ICD-10-CM | POA: Diagnosis not present

## 2024-04-25 DIAGNOSIS — F411 Generalized anxiety disorder: Secondary | ICD-10-CM | POA: Diagnosis not present

## 2024-04-27 DIAGNOSIS — F900 Attention-deficit hyperactivity disorder, predominantly inattentive type: Secondary | ICD-10-CM | POA: Diagnosis not present

## 2024-04-27 DIAGNOSIS — F331 Major depressive disorder, recurrent, moderate: Secondary | ICD-10-CM | POA: Diagnosis not present

## 2024-04-27 DIAGNOSIS — F411 Generalized anxiety disorder: Secondary | ICD-10-CM | POA: Diagnosis not present

## 2024-05-02 DIAGNOSIS — F411 Generalized anxiety disorder: Secondary | ICD-10-CM | POA: Diagnosis not present

## 2024-05-02 DIAGNOSIS — F339 Major depressive disorder, recurrent, unspecified: Secondary | ICD-10-CM | POA: Diagnosis not present
